# Patient Record
Sex: Female | Born: 1995 | Race: Black or African American | Hispanic: No | Marital: Single | Smoking: Former smoker
Health system: Southern US, Community
[De-identification: ages and names within clinical notes are randomized; demographics above are authoritative.]

## PROBLEM LIST (undated history)

## (undated) ENCOUNTER — Inpatient Hospital Stay (HOSPITAL_COMMUNITY): Payer: Self-pay

## (undated) DIAGNOSIS — F32A Depression, unspecified: Secondary | ICD-10-CM

## (undated) DIAGNOSIS — R87629 Unspecified abnormal cytological findings in specimens from vagina: Secondary | ICD-10-CM

## (undated) DIAGNOSIS — R0600 Dyspnea, unspecified: Secondary | ICD-10-CM

## (undated) DIAGNOSIS — A5901 Trichomonal vulvovaginitis: Secondary | ICD-10-CM

## (undated) DIAGNOSIS — G709 Myoneural disorder, unspecified: Secondary | ICD-10-CM

## (undated) DIAGNOSIS — D649 Anemia, unspecified: Secondary | ICD-10-CM

## (undated) DIAGNOSIS — A549 Gonococcal infection, unspecified: Secondary | ICD-10-CM

## (undated) DIAGNOSIS — F319 Bipolar disorder, unspecified: Secondary | ICD-10-CM

## (undated) HISTORY — PX: DILATION AND EVACUATION: SHX1459

## (undated) HISTORY — PX: DILATION AND CURETTAGE OF UTERUS: SHX78

## (undated) HISTORY — PX: HERNIA REPAIR: SHX51

---
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
## 2015-01-05 ENCOUNTER — Emergency Department (HOSPITAL_COMMUNITY)
Admission: EM | Admit: 2015-01-05 | Discharge: 2015-01-05 | Disposition: A | Discharge status: Home / Self Care | Payer: Self-pay | Attending: Emergency Medicine | Admitting: Emergency Medicine

## 2015-01-05 ENCOUNTER — Encounter (HOSPITAL_COMMUNITY): Payer: Self-pay | Admitting: Emergency Medicine

## 2015-01-05 DIAGNOSIS — Z792 Long term (current) use of antibiotics: Secondary | ICD-10-CM | POA: Insufficient documentation

## 2015-01-05 DIAGNOSIS — R51 Headache: Secondary | ICD-10-CM | POA: Insufficient documentation

## 2015-01-05 DIAGNOSIS — R519 Headache, unspecified: Secondary | ICD-10-CM

## 2015-01-05 DIAGNOSIS — Z79899 Other long term (current) drug therapy: Secondary | ICD-10-CM | POA: Insufficient documentation

## 2015-01-05 DIAGNOSIS — Z720 Tobacco use: Secondary | ICD-10-CM | POA: Insufficient documentation

## 2015-01-05 DIAGNOSIS — R110 Nausea: Secondary | ICD-10-CM | POA: Insufficient documentation

## 2015-01-05 MED ORDER — METOCLOPRAMIDE HCL 5 MG/ML IJ SOLN
10.0000 mg | Freq: Once | INTRAMUSCULAR | Status: AC
  Administered 2015-01-05: 10 mg via INTRAVENOUS
  Filled 2015-01-05: qty 2

## 2015-01-05 MED ORDER — KETOROLAC TROMETHAMINE 30 MG/ML IJ SOLN
30.0000 mg | Freq: Once | INTRAMUSCULAR | Status: AC
  Administered 2015-01-05: 30 mg via INTRAVENOUS
  Filled 2015-01-05: qty 1

## 2015-01-05 MED ORDER — DIPHENHYDRAMINE HCL 50 MG/ML IJ SOLN
12.5000 mg | Freq: Once | INTRAMUSCULAR | Status: AC
  Administered 2015-01-05: 12.5 mg via INTRAVENOUS
  Filled 2015-01-05: qty 1

## 2015-01-05 NOTE — ED Notes (Signed)
Patient has a headache since 01/02/2014 @ 1900. Patient took 8 Coricidin on saturday. Patient says that she smoked some weed and cigerrettes

## 2015-01-05 NOTE — ED Notes (Signed)
Patient is alert and oriented x3.  She was given DC instructions and follow up visit instructions.  Patient gave verbal understanding. She was DC ambulatory under her own power to home.  V/S stable.  He was not showing any signs of distress on DC 

## 2015-01-05 NOTE — ED Provider Notes (Signed)
CSN: [id number]     Arrival date & time 01/05/15  0350 History   First MD Initiated Contact with Patient 01/05/15 0359     Chief Complaint  Patient presents with  . Migraine     (Consider location/radiation/quality/duration/timing/severity/associated sxs/prior Treatment) Patient is a 19 y.o. female presenting with migraines. The history is provided by the patient. No language interpreter was used.  Migraine This is a new problem. The current episode started yesterday. The problem occurs constantly. The problem has been unchanged. Associated symptoms include headaches and nausea. Pertinent negatives include no chest pain, chills, congestion, coughing, fever, neck pain or weakness. Associated symptoms comments: Generalized headache that started yesterday, is described as throbbing, causing nausea without vomiting. No fever, sinus congestion, sore throat or cough. Nothing makes it better and standing or sitting up makes it worse. She denies injury.Marland Kitchen.    History reviewed. No pertinent past medical history. History reviewed. No pertinent past surgical history. History reviewed. No pertinent family history. History  Substance Use Topics  . Smoking status: Current Every Day Smoker  . Smokeless tobacco: Never Used  . Alcohol Use: No   OB History    No data available     Review of Systems  Constitutional: Negative for fever and chills.  HENT: Negative for congestion and sinus pressure.   Respiratory: Negative.  Negative for cough.   Cardiovascular: Negative.  Negative for chest pain.  Gastrointestinal: Positive for nausea.  Musculoskeletal: Negative.  Negative for neck pain and neck stiffness.  Skin: Negative.   Neurological: Positive for headaches. Negative for syncope and weakness.      Allergies  Review of patient's allergies indicates no known allergies.  Home Medications   Prior to Admission medications   Medication Sig Start Date End Date Taking? Authorizing Provider   Chlorpheniramine-DM 4-30 MG TABS Take 2 tablets by mouth every 6 (six) hours as needed (for pain).   Yes Historical Provider, MD  cephALEXin (KEFLEX) 500 MG capsule Take 1 capsule (500 mg total) by mouth 2 (two) times daily. Patient not taking: Reported on 01/05/2015 11/17/14   Junius FinnerErin O'Malley, PA-C  ibuprofen (ADVIL,MOTRIN) 600 MG tablet Take 1 tablet (600 mg total) by mouth every 6 (six) hours as needed. Patient not taking: Reported on 01/05/2015 11/17/14   Junius FinnerErin O'Malley, PA-C  potassium chloride (K-DUR) 10 MEQ tablet Take 2 tablets (20 mEq total) by mouth daily. Patient not taking: Reported on 01/05/2015 11/19/14   Antony MaduraKelly Humes, PA-C  traMADol (ULTRAM) 50 MG tablet Take 1 tablet (50 mg total) by mouth every 6 (six) hours as needed. Patient not taking: Reported on 01/05/2015 11/17/14   Junius FinnerErin O'Malley, PA-C   BP 109/47 mmHg  Pulse 58  Temp(Src) 98.4 F (36.9 C) (Oral)  Resp 18  SpO2 99% Physical Exam  Constitutional: She is oriented to person, place, and time. She appears well-developed and well-nourished.  HENT:  Head: Normocephalic.  Nose: No mucosal edema. Right sinus exhibits no frontal sinus tenderness. Left sinus exhibits no frontal sinus tenderness.  Neck: Normal range of motion. Neck supple.  Cardiovascular: Normal rate and regular rhythm.   Pulmonary/Chest: Effort normal and breath sounds normal.  Abdominal: Soft. Bowel sounds are normal. There is no tenderness. There is no rebound and no guarding.  Musculoskeletal: Normal range of motion.  Neurological: She is alert and oriented to person, place, and time. Coordination normal.  CN's 3-12 grossly intact. Speech clear and focused. No coordination deficits.   Skin: Skin is warm and dry. No rash  noted.  Psychiatric: She has a normal mood and affect.    ED Course  Procedures (including critical care time) Labs Review Labs Reviewed - No data to display  Imaging Review No results found.   EKG Interpretation None      MDM    Final diagnoses:  None    1. Nonspecific headache  No neurologic deficits on exam. Headache better with medications in ED. VSS. She is felt appropriate for discharge home without concern for acute intracranial process.    Elpidio AnisShari Gilles Trimpe, PA-C 01/05/15 [id number]  Dione Boozeavid Glick, MD 01/05/15 [phone]

## 2015-01-05 NOTE — ED Notes (Signed)
Bed: [id number] Expected date: 01/05/15 Expected time: 3:28 AM Means of arrival: Ambulance Comments: headache

## 2015-01-05 NOTE — Discharge Instructions (Signed)
°Emergency Department Resource Guide °1) Find a Doctor and Pay Out of Pocket °Although you won't have to find out who is covered by your insurance plan, it is a good idea to ask around and get recommendations. You will then need to call the office and see if the doctor you have chosen will accept you as a new patient and what types of options they offer for patients who are self-pay. Some doctors offer discounts or will set up payment plans for their patients who do not have insurance, but you will need to ask so you aren't surprised when you get to your appointment. ° °2) Contact Your Local Health Department °Not all health departments have doctors that can see patients for sick visits, but many do, so it is worth a call to see if yours does. If you don't know where your local health department is, you can check in your phone book. The CDC also has a tool to help you locate your state's health department, and many state websites also have listings of all of their local health departments. ° °3) Find a Walk-in Clinic °If your illness is not likely to be very severe or complicated, you may want to try a walk in clinic. These are popping up all over the country in pharmacies, drugstores, and shopping centers. They're usually staffed by nurse practitioners or physician assistants that have been trained to treat common illnesses and complaints. They're usually fairly quick and inexpensive. However, if you have serious medical issues or chronic medical problems, these are probably not your best option. ° °No Primary Care Doctor: °- Call [facility] at  832-8000 - they can help you locate a primary care doctor that  accepts your insurance, provides certain services, etc. °- Physician Referral Service- 1-800-533-3463 ° °Chronic Pain Problems: °Organization         Address  Phone   Notes  °[facility]  [phone] Patients need to be referred by their primary care doctor.  ° °Medication  Assistance: °Organization         Address  Phone   Notes  °[place] [facility] [address] °[place], [place] [address] [phone] --Must be a resident of [place] °-- Must have NO insurance coverage whatsoever (no Medicaid/ Medicare, etc.) °-- The pt. MUST have a primary care doctor that directs their care regularly and follows them in the community °  °[facility]  (866) 331-1348   °[employer]  (888) 892-1162   ° °Agencies that provide inexpensive medical care: °Organization         Address  Phone   Notes  °[facility]  [phone]   °[facility]    [phone]   °[facility] [address] °[place], [place] [address] [phone]   °[facility] [address], °[place] [phone]   °[facility]    ([phone]) [phone]   °[facility]    ([phone]) [phone]   °[facility] ° [address], [place] Phone:  ([phone]) [phone], Fax:  [phone] Hours of Operation:  9 am - 6 pm, M-F.  Also accepts Medicaid/Medicare and self-pay.  °[facility] ° [address], [place] Phone: [phone], Fax: [phone]. Hours of Operation:  8:30 am - 5:30 pm, M-F.  Also accepts Medicaid and self-pay.  °[facility] [place] [address], [place] Phone: [phone](336) [phone]   [facility] [address], [place], [place] [phone], Ext. [phone] Mondays & Thursdays: 7-9 AM.  First 15 patients are seen on a first come, first serve basis.    Medicaid-accepting [facility] Providers:  Organization         Address  Phone   Notes  [facility] [address], [place] [phone] Also accepts self-pay patients.  [facility] [address], [place]  [phone]   [facility] [address], [place]  [phone]   [facility] [address] [place] [address], [place] [phone]   Renaye RakersVeita Bland [address], [place]   [phone] Only accepts [place] patients after they have their name applied to their card.   Self-Pay (no insurance) in [facility] [facility]:  Organization         Address  Phone   Notes  Sickle Cell Patients, [facility] [address], [place] [phone]   [facility] [address], [place] [phone]   [facility] [place]  [address], [place] [phone]   [facility]/Dr. Osei-Bonsu  [address] [place] [address], [place] or [phone], [place] [phone] Phone number for both [place] and [place] locations is the same.  [facility] [address], [place] [phone]   [facility] [place] [facility] [address] [place] [address], [place] or [address] [phone]   [facility] [address], [place] [phone], phone; [phone], fax Sees patients 1st and 3rd Saturday of every month.  Must not qualify for public or private insurance (i.e. Medicaid, Medicare, [place] [employer] Choice, Veterans' Benefits)  Household income should be no more than 200% of the poverty level The clinic cannot treat you if you are pregnant or think you are pregnant  Sexually transmitted diseases are not treated at the clinic.       General Headache Without Cause A headache is pain or discomfort felt around the head or neck area. The specific cause of a headache may not be found. There are many causes and types of headaches. A few common ones are:  Tension headaches.  Migraine headaches.  Cluster headaches.  Chronic daily headaches. HOME CARE INSTRUCTIONS   Keep all follow-up appointments with your caregiver or any specialist referral.  Only take  over-the-counter or prescription medicines for pain or discomfort as directed by your caregiver.  Lie down in a dark, quiet room when you have a headache.  Keep a headache journal to find out what may trigger your migraine headaches. For example, write down:  What you eat and drink.  How much sleep you get.  Any change to your diet or medicines.  Try massage or other relaxation techniques.  Put ice packs or heat on the head and neck. Use these 3 to 4 times per day for 15 to 20 minutes each time, or as needed.  Limit stress.  Sit up straight, and do not tense your muscles.  Quit smoking if you smoke.  Limit alcohol use.  Decrease the amount of caffeine you drink, or stop drinking caffeine.  Eat and sleep on a regular schedule.  Get 7 to 9 hours of sleep, or  as recommended by your caregiver.  Keep lights dim if bright lights bother you and make your headaches worse. SEEK MEDICAL CARE IF:   You have problems with the medicines you were prescribed.  Your medicines are not working.  You have a change from the usual headache.  You have nausea or vomiting. SEEK IMMEDIATE MEDICAL CARE IF:   Your headache becomes severe.  You have a fever.  You have a stiff neck.  You have loss of vision.  You have muscular weakness or loss of muscle control.  You start losing your balance or have trouble walking.  You feel faint or pass out.  You have severe symptoms that are different from your first symptoms. MAKE SURE YOU:   Understand these instructions.  Will watch your condition.  Will get help right away if you are not doing well or get worse. Document Released: 08/08/2005 Document Revised: 10/31/2011 Document Reviewed: 08/24/2011 [facility] Patient Information 2015 [place] [place]. This information is not intended to replace advice given to you by your health care provider. Make sure you discuss any questions you have with your health care provider.

## 2015-06-08 ENCOUNTER — Encounter (HOSPITAL_COMMUNITY): Payer: Self-pay | Admitting: *Deleted

## 2015-06-08 ENCOUNTER — Inpatient Hospital Stay (HOSPITAL_COMMUNITY)
Admission: AD | Admit: 2015-06-08 | Discharge: 2015-06-08 | Disposition: A | Discharge status: Home / Self Care | Payer: Medicaid Other | Source: Ambulatory Visit | Attending: Family Medicine | Admitting: Family Medicine

## 2015-06-08 DIAGNOSIS — N926 Irregular menstruation, unspecified: Secondary | ICD-10-CM

## 2015-06-08 DIAGNOSIS — Z3201 Encounter for pregnancy test, result positive: Secondary | ICD-10-CM | POA: Diagnosis not present

## 2015-06-08 DIAGNOSIS — Z3200 Encounter for pregnancy test, result unknown: Secondary | ICD-10-CM | POA: Diagnosis present

## 2015-06-08 LAB — POCT PREGNANCY, URINE: Preg Test, Ur: POSITIVE — AB

## 2015-06-08 NOTE — Discharge Instructions (Signed)
First Trimester of Pregnancy The first trimester of pregnancy is from week 1 until the end of week 12 (months 1 through 3). A week after a sperm fertilizes an egg, the egg will implant on the wall of the uterus. This embryo will begin to develop into a baby. Genes from you and your partner are forming the baby. The female genes determine whether the baby is a boy or a girl. At 6-8 weeks, the eyes and face are formed, and the heartbeat can be seen on ultrasound. At the end of 12 weeks, all the baby's organs are formed.  Now that you are pregnant, you will want to do everything you can to have a healthy baby. Two of the most important things are to get good prenatal care and to follow your health care provider's instructions. Prenatal care is all the medical care you receive before the baby's birth. This care will help prevent, find, and treat any problems during the pregnancy and childbirth. BODY CHANGES Your body goes through many changes during pregnancy. The changes vary from woman to woman.   You may gain or lose a couple of pounds at first.  You may feel sick to your stomach (nauseous) and throw up (vomit). If the vomiting is uncontrollable, call your health care provider.  You may tire easily.  You may develop headaches that can be relieved by medicines approved by your health care provider.  You may urinate more often. Painful urination may mean you have a bladder infection.  You may develop heartburn as a result of your pregnancy.  You may develop constipation because certain hormones are causing the muscles that push waste through your intestines to slow down.  You may develop hemorrhoids or swollen, bulging veins (varicose veins).  Your breasts may begin to grow larger and become tender. Your nipples may stick out more, and the tissue that surrounds them (areola) may become darker.  Your gums may bleed and may be sensitive to brushing and flossing.  Dark spots or blotches  (chloasma, mask of pregnancy) may develop on your face. This will likely fade after the baby is born.  Your menstrual periods will stop.  You may have a loss of appetite.  You may develop cravings for certain kinds of food.  You may have changes in your emotions from day to day, such as being excited to be pregnant or being concerned that something may go wrong with the pregnancy and baby.  You may have more vivid and strange dreams.  You may have changes in your hair. These can include thickening of your hair, rapid growth, and changes in texture. Some women also have hair loss during or after pregnancy, or hair that feels dry or thin. Your hair will most likely return to normal after your baby is born. WHAT TO EXPECT AT YOUR PRENATAL VISITS During a routine prenatal visit:  You will be weighed to make sure you and the baby are growing normally.  Your blood pressure will be taken.  Your abdomen will be measured to track your baby's growth.  The fetal heartbeat will be listened to starting around week 10 or 12 of your pregnancy.  Test results from any previous visits will be discussed. Your health care provider may ask you:  How you are feeling.  If you are feeling the baby move.  If you have had any abnormal symptoms, such as leaking fluid, bleeding, severe headaches, or abdominal cramping.  If you are using any tobacco products,   including cigarettes, chewing tobacco, and electronic cigarettes.  If you have any questions. Other tests that may be performed during your first trimester include:  Blood tests to find your blood type and to check for the presence of any previous infections. They will also be used to check for low iron levels (anemia) and Rh antibodies. Later in the pregnancy, blood tests for diabetes will be done along with other tests if problems develop.  Urine tests to check for infections, diabetes, or protein in the urine.  An ultrasound to confirm the  proper growth and development of the baby.  An amniocentesis to check for possible genetic problems.  Fetal screens for spina bifida and Down syndrome.  You may need other tests to make sure you and the baby are doing well.  HIV (human immunodeficiency virus) testing. Routine prenatal testing includes screening for HIV, unless you choose not to have this test. HOME CARE INSTRUCTIONS  Medicines  Follow your health care provider's instructions regarding medicine use. Specific medicines may be either safe or unsafe to take during pregnancy.  Take your prenatal vitamins as directed.  If you develop constipation, try taking a stool softener if your health care provider approves. Diet  Eat regular, well-balanced meals. Choose a variety of foods, such as meat or vegetable-based protein, fish, milk and low-fat dairy products, vegetables, fruits, and whole grain breads and cereals. Your health care provider will help you determine the amount of weight gain that is right for you.  Avoid raw meat and uncooked cheese. These carry germs that can cause birth defects in the baby.  Eating four or five small meals rather than three large meals a day may help relieve nausea and vomiting. If you start to feel nauseous, eating a few soda crackers can be helpful. Drinking liquids between meals instead of during meals also seems to help nausea and vomiting.  If you develop constipation, eat more high-fiber foods, such as fresh vegetables or fruit and whole grains. Drink enough fluids to keep your urine clear or pale yellow. Activity and Exercise  Exercise only as directed by your health care provider. Exercising will help you:  Control your weight.  Stay in shape.  Be prepared for labor and delivery.  Experiencing pain or cramping in the lower abdomen or low back is a good sign that you should stop exercising. Check with your health care provider before continuing normal exercises.  Try to avoid  standing for long periods of time. Move your legs often if you must stand in one place for a long time.  Avoid heavy lifting.  Wear low-heeled shoes, and practice good posture.  You may continue to have sex unless your health care provider directs you otherwise. Relief of Pain or Discomfort  Wear a good support bra for breast tenderness.   Take warm sitz baths to soothe any pain or discomfort caused by hemorrhoids. Use hemorrhoid cream if your health care provider approves.   Rest with your legs elevated if you have leg cramps or low back pain.  If you develop varicose veins in your legs, wear support hose. Elevate your feet for 15 minutes, 3-4 times a day. Limit salt in your diet. Prenatal Care  Schedule your prenatal visits by the twelfth week of pregnancy. They are usually scheduled monthly at first, then more often in the last 2 months before delivery.  Write down your questions. Take them to your prenatal visits.  Keep all your prenatal visits as directed by your   health care provider. Safety  Wear your seat belt at all times when driving.  Make a list of emergency phone numbers, including numbers for family, friends, the hospital, and police and fire departments. General Tips  Ask your health care provider for a referral to a local prenatal education class. Begin classes no later than at the beginning of month 6 of your pregnancy.  Ask for help if you have counseling or nutritional needs during pregnancy. Your health care provider can offer advice or refer you to specialists for help with various needs.  Do not use hot tubs, steam rooms, or saunas.  Do not douche or use tampons or scented sanitary pads.  Do not cross your legs for long periods of time.  Avoid cat litter boxes and soil used by cats. These carry germs that can cause birth defects in the baby and possibly loss of the fetus by miscarriage or stillbirth.  Avoid all smoking, herbs, alcohol, and medicines  not prescribed by your health care provider. Chemicals in these affect the formation and growth of the baby.  Do not use any tobacco products, including cigarettes, chewing tobacco, and electronic cigarettes. If you need help quitting, ask your health care provider. You may receive counseling support and other resources to help you quit.  Schedule a dentist appointment. At home, brush your teeth with a soft toothbrush and be gentle when you floss. SEEK MEDICAL CARE IF:   You have dizziness.  You have mild pelvic cramps, pelvic pressure, or nagging pain in the abdominal area.  You have persistent nausea, vomiting, or diarrhea.  You have a bad smelling vaginal discharge.  You have pain with urination.  You notice increased swelling in your face, hands, legs, or ankles. SEEK IMMEDIATE MEDICAL CARE IF:   You have a fever.  You are leaking fluid from your vagina.  You have spotting or bleeding from your vagina.  You have severe abdominal cramping or pain.  You have rapid weight gain or loss.  You vomit blood or material that looks like coffee grounds.  You are exposed to German measles and have never had them.  You are exposed to fifth disease or chickenpox.  You develop a severe headache.  You have shortness of breath.  You have any kind of trauma, such as from a fall or a car accident.   This information is not intended to replace advice given to you by your health care provider. Make sure you discuss any questions you have with your health care provider.   Document Released: 08/02/2001 Document Revised: 08/29/2014 Document Reviewed: 06/18/2013 Elsevier Interactive Patient Education 2016 [employer]  Safe Medications in Pregnancy   Acne: Benzoyl Peroxide Salicylic Acid  Backache/Headache: Tylenol: 2 regular strength every 4 hours OR              2 Extra strength every 6 hours  Colds/Coughs/Allergies: Benadryl (alcohol free) 25 mg every 6 hours as  needed Breath right strips Claritin Cepacol throat lozenges Chloraseptic throat spray Cold-Eeze- up to three times per day Cough drops, alcohol free Flonase (by prescription only) Guaifenesin Mucinex Robitussin DM (plain only, alcohol free) Saline nasal spray/drops Sudafed (pseudoephedrine) & Actifed ** use only after [redacted] weeks gestation and if you do not have high blood pressure Tylenol Vicks Vaporub Zinc lozenges Zyrtec   Constipation: Colace Ducolax suppositories Fleet enema Glycerin suppositories Metamucil Milk of magnesia Miralax Senokot Smooth move tea  Diarrhea: Kaopectate Imodium A-D  *NO pepto Bismol  Hemorrhoids: Anusol Anusol   HC Preparation H Tucks  Indigestion: Tums Maalox Mylanta Zantac  Pepcid  Insomnia: Benadryl (alcohol free) 25mg every 6 hours as needed Tylenol PM Unisom, no Gelcaps  Leg Cramps: Tums MagGel  Nausea/Vomiting:  Bonine Dramamine Emetrol Ginger extract Sea bands Meclizine  Nausea medication to take during pregnancy:  Unisom (doxylamine succinate 25 mg tablets) Take one tablet daily at bedtime. If symptoms are not adequately controlled, the dose can be increased to a maximum recommended dose of two tablets daily (1/2 tablet in the morning, 1/2 tablet mid-afternoon and one at bedtime). Vitamin B6 100mg tablets. Take one tablet twice a day (up to 200 mg per day).  Skin Rashes: Aveeno products Benadryl cream or 25mg every 6 hours as needed Calamine Lotion 1% cortisone cream  Yeast infection: Gyne-lotrimin 7 Monistat 7   **If taking multiple medications, please check labels to avoid duplicating the same active ingredients **take medication as directed on the label ** Do not exceed 4000 mg of tylenol in 24 hours **Do not take medications that contain aspirin or ibuprofen    

## 2015-06-08 NOTE — MAU Provider Note (Signed)
S:  Karen Frederick is a [age] y.o. G1P0 at 2915w0d wks here for confirmation of pregnancy.  Patient's Patient's last menstrual period was 05/11/2015.Marland Kitchen.  Denies any vaginal bleeding or abdominal pain.  Plans to get prenatal care at undecided.  O:  Past Medical History  Diagnosis Date  . Medical history non-contributory     Family History  Problem Relation Age of Onset  . Alcohol abuse Neg Hx   . Arthritis Neg Hx   . Asthma Neg Hx   . Birth defects Neg Hx   . Cancer Neg Hx   . COPD Neg Hx   . Depression Neg Hx   . Diabetes Neg Hx   . Drug abuse Neg Hx   . Early death Neg Hx   . Hearing loss Neg Hx   . Heart disease Neg Hx   . Hyperlipidemia Neg Hx   . Hypertension Neg Hx   . Kidney disease Neg Hx   . Learning disabilities Neg Hx   . Mental illness Neg Hx   . Mental retardation Neg Hx   . Miscarriages / Stillbirths Neg Hx   . Stroke Neg Hx   . Vision loss Neg Hx   . Varicose Veins Neg Hx      Filed Vitals:   06/08/15 1433  BP: 157/80  Pulse: 119  Temp: 98.6 F (37 C)  Resp: 18   General:  A&OX3 with no signs of acute distress. She appears well-developed and well-nourished. No distress.  Neck: Normal range of motion.  Pulmonary/Chest: Effort normal. No respiratory distress.  Musculoskeletal: Normal range of motion.  Neurological: She is alert and oriented to person, place, and time.  Skin: Skin is warm and dry.   A: Positive Pregnancy Test  P: Explained benefits of taking prenatal vitamins w/folic acid early in pregnancy. Begin prenatal care. Reviewed warning signs of pregnancy. Pregnancy verification letter & ob/gyn list given to patient Discussed purpose of MAU & when to return  Judeth HornErin Dawanna Grauberger, NP

## 2015-06-08 NOTE — MAU Note (Signed)
Denies any problems; had 2 positive pregnancy test at home; desires to get a verification letter;

## 2015-06-11 ENCOUNTER — Inpatient Hospital Stay (HOSPITAL_COMMUNITY)
Admission: AD | Admit: 2015-06-11 | Discharge: 2015-06-11 | Disposition: A | Discharge status: Home / Self Care | Payer: Medicaid Other | Source: Ambulatory Visit | Attending: Obstetrics & Gynecology | Admitting: Obstetrics & Gynecology

## 2015-06-11 ENCOUNTER — Encounter (HOSPITAL_COMMUNITY): Payer: Self-pay | Admitting: *Deleted

## 2015-06-11 ENCOUNTER — Inpatient Hospital Stay (HOSPITAL_COMMUNITY): Payer: Medicaid Other

## 2015-06-11 DIAGNOSIS — O3680X0 Pregnancy with inconclusive fetal viability, not applicable or unspecified: Secondary | ICD-10-CM

## 2015-06-11 DIAGNOSIS — O209 Hemorrhage in early pregnancy, unspecified: Secondary | ICD-10-CM

## 2015-06-11 DIAGNOSIS — Z3A01 Less than 8 weeks gestation of pregnancy: Secondary | ICD-10-CM

## 2015-06-11 DIAGNOSIS — R58 Hemorrhage, not elsewhere classified: Secondary | ICD-10-CM

## 2015-06-11 DIAGNOSIS — O26851 Spotting complicating pregnancy, first trimester: Secondary | ICD-10-CM | POA: Insufficient documentation

## 2015-06-11 DIAGNOSIS — F17210 Nicotine dependence, cigarettes, uncomplicated: Secondary | ICD-10-CM | POA: Diagnosis not present

## 2015-06-11 DIAGNOSIS — O99331 Smoking (tobacco) complicating pregnancy, first trimester: Secondary | ICD-10-CM | POA: Insufficient documentation

## 2015-06-11 HISTORY — DX: Trichomonal vulvovaginitis: A59.01

## 2015-06-11 LAB — CBC
HCT: 35.9 % — ABNORMAL LOW (ref 36.0–46.0)
Hemoglobin: 11.5 g/dL — ABNORMAL LOW (ref 12.0–15.0)
MCH: 27.3 pg (ref 26.0–34.0)
MCHC: 32 g/dL (ref 30.0–36.0)
MCV: 85.3 fL (ref 78.0–100.0)
Platelets: 225 10*3/uL (ref 150–400)
RBC: 4.21 MIL/uL (ref 3.87–5.11)
RDW: 14.6 % (ref 11.5–15.5)
WBC: 7.3 10*3/uL (ref 4.0–10.5)

## 2015-06-11 LAB — URINALYSIS, ROUTINE W REFLEX MICROSCOPIC
Bilirubin Urine: NEGATIVE
Glucose, UA: NEGATIVE mg/dL
KETONES UR: NEGATIVE mg/dL
Leukocytes, UA: NEGATIVE
Nitrite: NEGATIVE
PROTEIN: NEGATIVE mg/dL
Specific Gravity, Urine: 1.01 (ref 1.005–1.030)
Urobilinogen, UA: 0.2 mg/dL (ref 0.0–1.0)
pH: 6 (ref 5.0–8.0)

## 2015-06-11 LAB — ABO/RH: ABO/RH(D): O POS

## 2015-06-11 LAB — URINE MICROSCOPIC-ADD ON

## 2015-06-11 LAB — HCG, QUANTITATIVE, PREGNANCY: hCG, Beta Chain, Quant, S: 277 m[IU]/mL — ABNORMAL HIGH (ref <5)

## 2015-06-11 NOTE — MAU Note (Signed)
Started spotting last wk on Friday, when expected period.  Having very light cramping.

## 2015-06-11 NOTE — MAU Note (Signed)
Pt c/o spotting on and off with mild cramping. Pt wants to make sure everything is OK

## 2015-06-11 NOTE — Discharge Instructions (Signed)
Ectopic Pregnancy °An ectopic pregnancy is when the fertilized egg attaches (implants) outside the uterus. Most ectopic pregnancies occur in the fallopian tube. Rarely do ectopic pregnancies occur on the ovary, intestine, pelvis, or cervix. In an ectopic pregnancy, the fertilized egg does not have the ability to develop into a normal, healthy baby.  °A ruptured ectopic pregnancy is one in which the fallopian tube gets torn or bursts and results in internal bleeding. Often there is intense abdominal pain, and sometimes, vaginal bleeding. Having an ectopic pregnancy can be life threatening. If left untreated, this dangerous condition can lead to a blood transfusion, abdominal surgery, or even death. °CAUSES  °Damage to the fallopian tubes is the suspected cause in most ectopic pregnancies.  °RISK FACTORS °Depending on your circumstances, the risk of having an ectopic pregnancy will vary. The level of risk can be divided into three categories. °High Risk °· You have gone through infertility treatment. °· You have had a previous ectopic pregnancy. °· You have had previous tubal surgery. °· You have had previous surgery to have the fallopian tubes tied (tubal ligation). °· You have tubal problems or diseases. °· You have been exposed to DES. DES is a medicine that was used until 1971 and had effects on babies whose mothers took the medicine. °· You become pregnant while using an intrauterine device (IUD) for birth control.  °Moderate Risk °· You have a history of infertility. °· You have a history of a sexually transmitted infection (STI). °· You have a history of pelvic inflammatory disease (PID). °· You have scarring from endometriosis. °· You have multiple sexual partners. °· You smoke.  °Low Risk °· You have had previous pelvic surgery. °· You use vaginal douching. °· You became sexually active before 18 years of age. °SIGNS AND SYMPTOMS  °An ectopic pregnancy should be suspected in anyone who has missed a period and  has abdominal pain or bleeding. °· You may experience normal pregnancy symptoms, such as: °¨ Nausea. °¨ Tiredness. °¨ Breast tenderness. °· Other symptoms may include: °¨ Pain with intercourse. °¨ Irregular vaginal bleeding or spotting. °¨ Cramping or pain on one side or in the lower abdomen. °¨ Fast heartbeat. °¨ Passing out while having a bowel movement. °· Symptoms of a ruptured ectopic pregnancy and internal bleeding may include: °¨ Sudden, severe pain in the abdomen and pelvis. °¨ Dizziness or fainting. °¨ Pain in the shoulder area. °DIAGNOSIS  °Tests that may be performed include: °· A pregnancy test. °· An ultrasound test. °· Testing the specific level of pregnancy hormone in the bloodstream. °· Taking a sample of uterus tissue (dilation and curettage, D&C). °· Surgery to perform a visual exam of the inside of the abdomen using a thin, lighted tube with a tiny camera on the end (laparoscope). °TREATMENT  °An injection of a medicine called methotrexate may be given. This medicine causes the pregnancy tissue to be absorbed. It is given if: °· The diagnosis is made early. °· The fallopian tube has not ruptured. °· You are considered to be a good candidate for the medicine. °Usually, pregnancy hormone blood levels are checked after methotrexate treatment. This is to be sure the medicine is effective. It may take 4-6 weeks for the pregnancy to be absorbed (though most pregnancies will be absorbed by 3 weeks). °Surgical treatment may be needed. A laparoscope may be used to remove the pregnancy tissue. If severe internal bleeding occurs, a cut (incision) may be made in the lower abdomen (laparotomy), and the ectopic   pregnancy is removed. This stops the bleeding. Part of the fallopian tube, or the whole tube, may be removed as well (salpingectomy). After surgery, pregnancy hormone tests may be done to be sure there is no pregnancy tissue left. You may receive a Rho (D) immune globulin shot if you are Rh negative and  the father is Rh positive, or if you do not know the Rh type of the father. This is to prevent problems with any future pregnancy. SEEK IMMEDIATE MEDICAL CARE IF:  You have any symptoms of an ectopic pregnancy. This is a medical emergency. MAKE SURE YOU:  Understand these instructions.  Will watch your condition.  Will get help right away if you are not doing well or get worse.   This information is not intended to replace advice given to you by your health care provider. Make sure you discuss any questions you have with your health care provider.   Document Released: 09/15/2004 Document Revised: 08/29/2014 Document Reviewed: 03/07/2013 Elsevier Interactive Patient Education 2016 [employer] Threatened Miscarriage A threatened miscarriage occurs when you have vaginal bleeding during your first 20 weeks of pregnancy but the pregnancy has not ended. If you have vaginal bleeding during this time, your health care provider will do tests to make sure you are still pregnant. If the tests show you are still pregnant and the developing baby (fetus) inside your womb (uterus) is still growing, your condition is considered a threatened miscarriage. A threatened miscarriage does not mean your pregnancy will end, but it does increase the risk of losing your pregnancy (complete miscarriage). CAUSES  The cause of a threatened miscarriage is usually not known. If you go on to have a complete miscarriage, the most common cause is an abnormal number of chromosomes in the developing baby. Chromosomes are the structures inside cells that hold all your genetic material. Some causes of vaginal bleeding that do not result in miscarriage include:  Having sex.  Having an infection.  Normal hormone changes of pregnancy.  Bleeding that occurs when an egg implants in your uterus. RISK FACTORS Risk factors for bleeding in early pregnancy include:  Obesity.  Smoking.  Drinking excessive amounts of alcohol  or caffeine.  Recreational drug use. SIGNS AND SYMPTOMS  Light vaginal bleeding.  Mild abdominal pain or cramps. DIAGNOSIS  If you have bleeding with or without abdominal pain before 20 weeks of pregnancy, your health care provider will do tests to check whether you are still pregnant. One important test involves using sound waves and a computer (ultrasound) to create images of the inside of your uterus. Other tests include an internal exam of your vagina and uterus (pelvic exam) and measurement of your baby's heart rate.  You may be diagnosed with a threatened miscarriage if:  Ultrasound testing shows you are still pregnant.  Your baby's heart rate is strong.  A pelvic exam shows that the opening between your uterus and your vagina (cervix) is closed.  Your heart rate and blood pressure are stable.  Blood tests confirm you are still pregnant. TREATMENT  No treatments have been shown to prevent a threatened miscarriage from going on to a complete miscarriage. However, the right home care is important.  HOME CARE INSTRUCTIONS   Make sure you keep all your appointments for prenatal care. This is very important.  Get plenty of rest.  Do not have sex or use tampons if you have vaginal bleeding.  Do not douche.  Do not smoke or use recreational drugs.  Do not drink alcohol.  Avoid caffeine. SEEK MEDICAL CARE IF:  You have light vaginal bleeding or spotting while pregnant.  You have abdominal pain or cramping.  You have a fever. SEEK IMMEDIATE MEDICAL CARE IF:  You have heavy vaginal bleeding.  You have blood clots coming from your vagina.  You have severe low back pain or abdominal cramps.  You have fever, chills, and severe abdominal pain. MAKE SURE YOU:  Understand these instructions.  Will watch your condition.  Will get help right away if you are not doing well or get worse.   This information is not intended to replace advice given to you by your health  care provider. Make sure you discuss any questions you have with your health care provider.   Document Released: 08/08/2005 Document Revised: 08/13/2013 Document Reviewed: 06/04/2013 Elsevier Interactive Patient Education [employer]

## 2015-06-11 NOTE — MAU Provider Note (Signed)
History     CSN: [id number]  Arrival date and time: 06/11/15 3474   First Provider Initiated Contact with Patient 06/11/15 1105      Chief Complaint  Patient presents with  . Vaginal Bleeding   HPI Kalimah M Westerhoff 19 y.o. G1P0 @ [redacted]w[redacted]d presents with vaginal spotting and abdominal pain.    Past Medical History  Diagnosis Date  . Medical history non-contributory   . Trichomonal vaginitis     Past Surgical History  Procedure Laterality Date  . Hernia repair      umbilical    Family History  Problem Relation Age of Onset  . Alcohol abuse Neg Hx   . Arthritis Neg Hx   . Asthma Neg Hx   . Birth defects Neg Hx   . COPD Neg Hx   . Depression Neg Hx   . Drug abuse Neg Hx   . Early death Neg Hx   . Hearing loss Neg Hx   . Heart disease Neg Hx   . Hyperlipidemia Neg Hx   . Hypertension Neg Hx   . Kidney disease Neg Hx   . Learning disabilities Neg Hx   . Mental illness Neg Hx   . Mental retardation Neg Hx   . Miscarriages / Stillbirths Neg Hx   . Stroke Neg Hx   . Vision loss Neg Hx   . Varicose Veins Neg Hx   . Diabetes Paternal Grandmother   . Cancer Paternal Grandmother     breast with mets    Social History  Substance Use Topics  . Smoking status: Current Every Day Smoker -- 0.25 packs/day    Types: Cigarettes  . Smokeless tobacco: Never Used     Comment: trying to quit  . Alcohol Use: No    Allergies: No Known Allergies  Prescriptions prior to admission  Medication Sig Dispense Refill Last Dose  . Prenatal Vit-Fe Fumarate-FA (PRENATAL MULTIVITAMIN) TABS tablet Take 1 tablet by mouth daily at 12 noon.   06/10/2015 at Unknown time    Review of Systems  Constitutional: Negative for fever.  Gastrointestinal: Positive for abdominal pain.  Genitourinary:       Vaginal spotting  All other systems reviewed and are negative.  Physical Exam   Blood pressure 144/75, pulse 82, temperature 98.8 F (37.1 C), temperature source Oral, resp. rate 18, height 5'  7" (1.702 m), weight 98.249 kg (216 lb 9.6 oz), last menstrual period 05/11/2015.  Physical Exam  Nursing note and vitals reviewed. Constitutional: She is oriented to person, place, and time. She appears well-developed and well-nourished.  HENT:  Head: Normocephalic and atraumatic.  Cardiovascular: Normal rate.   Respiratory: Effort normal. No respiratory distress.  GI: Soft. There is no tenderness.  Musculoskeletal: Normal range of motion.  Neurological: She is alert and oriented to person, place, and time.  Skin: Skin is warm and dry.  Psychiatric: She has a normal mood and affect. Her behavior is normal. Judgment and thought content normal.   Results for orders placed or performed during the hospital encounter of 06/11/15 (from the past 24 hour(s))  Urinalysis, Routine w reflex microscopic (not at [facility])     Status: Abnormal   Collection Time: 06/11/15 10:25 AM  Result Value Ref Range   Color, Urine YELLOW YELLOW   APPearance CLEAR CLEAR   Specific Gravity, Urine 1.010 1.005 - 1.030   pH 6.0 5.0 - 8.0   Glucose, UA NEGATIVE NEGATIVE mg/dL   Hgb urine dipstick TRACE (A) NEGATIVE  Bilirubin Urine NEGATIVE NEGATIVE   Ketones, ur NEGATIVE NEGATIVE mg/dL   Protein, ur NEGATIVE NEGATIVE mg/dL   Urobilinogen, UA 0.2 0.0 - 1.0 mg/dL   Nitrite NEGATIVE NEGATIVE   Leukocytes, UA NEGATIVE NEGATIVE  Urine microscopic-add on     Status: None   Collection Time: 06/11/15 10:25 AM  Result Value Ref Range   Squamous Epithelial / LPF RARE RARE   RBC / HPF 0-2 <3 RBC/hpf   Bacteria, UA RARE RARE  hCG, quantitative, pregnancy     Status: Abnormal   Collection Time: 06/11/15 11:10 AM  Result Value Ref Range   hCG, Beta Chain, Quant, S 277 (H) <5 mIU/mL  CBC     Status: Abnormal   Collection Time: 06/11/15 11:10 AM  Result Value Ref Range   WBC 7.3 4.0 - 10.5 K/uL   RBC 4.21 3.87 - 5.11 MIL/uL   Hemoglobin 11.5 (L) 12.0 - 15.0 g/dL   HCT 16.1 (L) 09.6 - 04.5 %   MCV 85.3 78.0 - 100.0 fL    MCH 27.3 26.0 - 34.0 pg   MCHC 32.0 30.0 - 36.0 g/dL   RDW 40.9 81.1 - 91.4 %   Platelets 225 150 - 400 K/uL  ABO/Rh     Status: None (Preliminary result)   Collection Time: 06/11/15 11:10 AM  Result Value Ref Range   ABO/RH(D) O POS   US Ob Comp Less 14 Wks  06/11/2015  CLINICAL DATA:  Bleeding and spotting intermittently, cramping. Symptoms since Friday 06/05/2015. Gravida 1 para 0. LMP 05/11/2015. Gestational age by LMP is 4 weeks 3 days. EDC by LMP is 02/15/2016. EXAM: OBSTETRIC <14 WK [place] AND TRANSVAGINAL OB US TECHNIQUE: Both transabdominal and transvaginal ultrasound examinations were performed for complete evaluation of the gestation as well as the maternal uterus, adnexal regions, and pelvic cul-de-sac. Transvaginal technique was performed to assess early pregnancy. COMPARISON:  None. FINDINGS: Intrauterine gestational sac: Present, very small Yolk sac:  No Embryo:  No Cardiac Activity: No MSD: 1.7 gestational age: Not applicable Maternal uterus/adnexae: No subchorionic hemorrhage. Right ovary has a normal appearance. Left corpus luteum cyst. Small amount of free pelvic fluid. IMPRESSION: 1. Probable early intrauterine gestational sac, but no yolk sac, fetal pole, or cardiac activity yet visualized. Recommend follow-up quantitative B-HCG levels and follow-up US in 14 days to confirm and assess viability. This recommendation follows SRU consensus guidelines: Diagnostic Criteria for Nonviable Pregnancy Early in the First Trimester. Malva Limes Med 2013; 782:9562-13. 2. No adnexal mass or evidence for ectopic pregnancy. 3. Serial quantitative beta HCG values and follow-up ultrasound are recommended as appropriate to document progression of and location of pregnancy. Ectopic pregnancy has not been excluded. Electronically Signed   By: Norva Pavlov M.D.   On: 06/11/2015 12:09   US Ob Transvaginal  06/11/2015  CLINICAL DATA:  Bleeding and spotting intermittently, cramping. Symptoms since Friday  06/05/2015. Gravida 1 para 0. LMP 05/11/2015. Gestational age by LMP is 4 weeks 3 days. EDC by LMP is 02/15/2016. EXAM: OBSTETRIC <14 WK [place] AND TRANSVAGINAL OB US TECHNIQUE: Both transabdominal and transvaginal ultrasound examinations were performed for complete evaluation of the gestation as well as the maternal uterus, adnexal regions, and pelvic cul-de-sac. Transvaginal technique was performed to assess early pregnancy. COMPARISON:  None. FINDINGS: Intrauterine gestational sac: Present, very small Yolk sac:  No Embryo:  No Cardiac Activity: No MSD: 1.7 gestational age: Not applicable Maternal uterus/adnexae: No subchorionic hemorrhage. Right ovary has a normal appearance. Left corpus luteum  cyst. Small amount of free pelvic fluid. IMPRESSION: 1. Probable early intrauterine gestational sac, but no yolk sac, fetal pole, or cardiac activity yet visualized. Recommend follow-up quantitative B-HCG levels and follow-up US in 14 days to confirm and assess viability. This recommendation follows SRU consensus guidelines: Diagnostic Criteria for Nonviable Pregnancy Early in the First Trimester. Malva Limes Engl J Med 2013; 284:1324-40; 369:1443-51. 2. No adnexal mass or evidence for ectopic pregnancy. 3. Serial quantitative beta HCG values and follow-up ultrasound are recommended as appropriate to document progression of and location of pregnancy. Ectopic pregnancy has not been excluded. Electronically Signed   By: Norva PavlovElizabeth  Brown M.D.   On: 06/11/2015 12:09   MAU Course  Procedures  MDM R/O ectopic- No evidence of ectopic pregnancy.   Assessment and Plan  Pregnancy of unknown location Repeat Beta HCG in 2 days Discharge to home- Ectopic /Bleeding Precautions  Afreen Siebels Grissett 06/11/2015, 12:36 PM

## 2015-06-13 ENCOUNTER — Encounter (HOSPITAL_COMMUNITY): Payer: Self-pay | Admitting: *Deleted

## 2015-06-13 ENCOUNTER — Inpatient Hospital Stay (HOSPITAL_COMMUNITY)
Admission: AD | Admit: 2015-06-13 | Discharge: 2015-06-13 | Disposition: A | Discharge status: Home / Self Care | Payer: Medicaid Other | Source: Ambulatory Visit | Attending: Family Medicine | Admitting: Family Medicine

## 2015-06-13 DIAGNOSIS — Z3A01 Less than 8 weeks gestation of pregnancy: Secondary | ICD-10-CM | POA: Insufficient documentation

## 2015-06-13 DIAGNOSIS — O26891 Other specified pregnancy related conditions, first trimester: Secondary | ICD-10-CM | POA: Insufficient documentation

## 2015-06-13 LAB — HCG, QUANTITATIVE, PREGNANCY: hCG, Beta Chain, Quant, S: 650 m[IU]/mL — ABNORMAL HIGH (ref <5)

## 2015-06-13 NOTE — MAU Provider Note (Signed)
Chief Complaint  Patient presents with  . Follow-up    Subjective:   Pt is a 19 y.o. G1P0 here for follow-up BHCG.  Upon review of the records patient was first seen on 10-20-16for vaginal bleeding (was not having abdominal pain).   BHCG on that day was 277.  Ultrasound showed IUGS small, no yolk sac, no embryo, nothing suspicious for ectopic pregnancy seen .  GC/CT and wet prep were not collected at first visit.   Pt discharged home 06-11-15.   Pt here today with no report of abdominal pain or vaginal bleeding.   All other systems negative.  Last seen in MAU on 06-11-15.      BHCG was 277.    Past Medical History  Diagnosis Date  . Medical history non-contributory   . Trichomonal vaginitis     OB History  Gravida Para Term Preterm AB SAB TAB Ectopic Multiple Living  1             # Outcome Date GA Lbr Len/2nd Weight Sex Delivery Anes PTL Lv  1 Current               Family History  Problem Relation Age of Onset  . Alcohol abuse Neg Hx   . Arthritis Neg Hx   . Asthma Neg Hx   . Birth defects Neg Hx   . COPD Neg Hx   . Depression Neg Hx   . Drug abuse Neg Hx   . Early death Neg Hx   . Hearing loss Neg Hx   . Heart disease Neg Hx   . Hyperlipidemia Neg Hx   . Hypertension Neg Hx   . Kidney disease Neg Hx   . Learning disabilities Neg Hx   . Mental illness Neg Hx   . Mental retardation Neg Hx   . Miscarriages / Stillbirths Neg Hx   . Stroke Neg Hx   . Vision loss Neg Hx   . Varicose Veins Neg Hx   . Diabetes Paternal Grandmother   . Cancer Paternal Grandmother     breast with mets    Objective: Physical Exam  Filed Vitals:   06/13/15 1136  BP: 105/65  Pulse: 79  Temp: 98.5 F (36.9 C)  Resp: 18   Constitutional: She is oriented to person, place, and time. She appears well-developed and well-nourished. No distress.  Pulmonary/Chest: Effort normal. No respiratory distress.  Musculoskeletal: Normal range of motion.  Neurological: She is alert and oriented to  person, place, and time.  Skin: Skin is warm and dry.  Results for orders placed or performed during the hospital encounter of 06/13/15 (from the past 24 hour(s))  hCG, quantitative, pregnancy     Status: Abnormal   Collection Time: 06/13/15 10:50 AM  Result Value Ref Range   hCG, Beta Chain, Quant, S 650 (H) <5 mIU/mL     Assessment: 19 y.o. G1P0 at 5536w5d wks Pregnancy in no distress and no vaginal bleeding.  Has never had lower abdominal pain in this pregnancy. Appropriately rising BHCG - doubled  Plan: Begin prenatal care as soon as possible. Pregnancy verification given. Advised to return with any further vaginal bleeding or if she is having lower abdominal pain.

## 2015-06-13 NOTE — MAU Note (Signed)
Feeling good.  Had light spotting yesterday morning, none since.  Denies any pain

## 2015-07-02 ENCOUNTER — Inpatient Hospital Stay (HOSPITAL_COMMUNITY): Payer: Medicaid Other

## 2015-07-02 ENCOUNTER — Inpatient Hospital Stay (HOSPITAL_COMMUNITY)
Admission: AD | Admit: 2015-07-02 | Discharge: 2015-07-02 | Disposition: A | Discharge status: Home / Self Care | Payer: Medicaid Other | Source: Ambulatory Visit | Attending: Family Medicine | Admitting: Family Medicine

## 2015-07-02 ENCOUNTER — Encounter (HOSPITAL_COMMUNITY): Payer: Self-pay

## 2015-07-02 DIAGNOSIS — B9689 Other specified bacterial agents as the cause of diseases classified elsewhere: Secondary | ICD-10-CM

## 2015-07-02 DIAGNOSIS — A499 Bacterial infection, unspecified: Secondary | ICD-10-CM | POA: Diagnosis not present

## 2015-07-02 DIAGNOSIS — N760 Acute vaginitis: Secondary | ICD-10-CM | POA: Insufficient documentation

## 2015-07-02 DIAGNOSIS — Z3A01 Less than 8 weeks gestation of pregnancy: Secondary | ICD-10-CM | POA: Diagnosis not present

## 2015-07-02 DIAGNOSIS — O209 Hemorrhage in early pregnancy, unspecified: Secondary | ICD-10-CM

## 2015-07-02 DIAGNOSIS — O23591 Infection of other part of genital tract in pregnancy, first trimester: Secondary | ICD-10-CM | POA: Diagnosis not present

## 2015-07-02 DIAGNOSIS — F17200 Nicotine dependence, unspecified, uncomplicated: Secondary | ICD-10-CM | POA: Diagnosis not present

## 2015-07-02 DIAGNOSIS — N898 Other specified noninflammatory disorders of vagina: Secondary | ICD-10-CM | POA: Diagnosis present

## 2015-07-02 LAB — WET PREP, GENITAL
Trich, Wet Prep: NONE SEEN
Yeast Wet Prep HPF POC: NONE SEEN

## 2015-07-02 MED ORDER — METRONIDAZOLE 500 MG PO TABS: 500.0000 mg | ORAL_TABLET | Freq: Two times a day (BID) | ORAL | Status: DC

## 2015-07-02 NOTE — MAU Provider Note (Signed)
History     CSN: [id number]  Arrival date and time: 07/02/15 0907   First Provider Initiated Contact with Patient 07/02/15 1009      Chief Complaint  Patient presents with  . Vaginal Discharge   HPI   Ms.Karen Frederick is a 19 y.o. female G1P0 presenting to MAU with  vaginal discharge that she first noticed last week. The discharge is white without an odor. The discharge is more than what she normally has.  She was seen in MAU a week or so ago with vaginal bleeding and had blood work and an US done. Her Quants doubled appropriately and she has not had a second US done to confirm IUP.  Today she denies pain or bleeding.   OB History    Gravida Para Term Preterm AB TAB SAB Ectopic Multiple Living   1               Past Medical History  Diagnosis Date  . Medical history non-contributory   . Trichomonal vaginitis     Past Surgical History  Procedure Laterality Date  . Hernia repair      umbilical    Family History  Problem Relation Age of Onset  . Alcohol abuse Neg Hx   . Arthritis Neg Hx   . Asthma Neg Hx   . Birth defects Neg Hx   . COPD Neg Hx   . Depression Neg Hx   . Drug abuse Neg Hx   . Early death Neg Hx   . Hearing loss Neg Hx   . Heart disease Neg Hx   . Hyperlipidemia Neg Hx   . Hypertension Neg Hx   . Kidney disease Neg Hx   . Learning disabilities Neg Hx   . Mental illness Neg Hx   . Mental retardation Neg Hx   . Miscarriages / Stillbirths Neg Hx   . Stroke Neg Hx   . Vision loss Neg Hx   . Varicose Veins Neg Hx   . Diabetes Paternal Grandmother   . Cancer Paternal Grandmother     breast with mets    Social History  Substance Use Topics  . Smoking status: Current Every Day Smoker -- 0.25 packs/day    Types: Cigarettes  . Smokeless tobacco: Never Used     Comment: trying to quit  . Alcohol Use: No    Allergies: No Known Allergies  Prescriptions prior to admission  Medication Sig Dispense Refill Last Dose  . Prenatal Vit-Fe  Fumarate-FA (PRENATAL MULTIVITAMIN) TABS tablet Take 1 tablet by mouth daily at 12 noon.   07/01/2015 at Unknown time   Results for orders placed or performed during the hospital encounter of 07/02/15 (from the past 24 hour(s))  Wet prep, genital     Status: Abnormal   Collection Time: 07/02/15  9:30 AM  Result Value Ref Range   Yeast Wet Prep HPF POC NONE SEEN NONE SEEN   Trich, Wet Prep NONE SEEN NONE SEEN   Clue Cells Wet Prep HPF POC FEW (A) NONE SEEN   WBC, Wet Prep HPF POC FEW (A) NONE SEEN   US Ob Transvaginal  07/02/2015  CLINICAL DATA:  Pregnant, vaginal bleeding EXAM: TRANSVAGINAL OB ULTRASOUND TECHNIQUE: Transvaginal ultrasound was performed for complete evaluation of the gestation as well as the maternal uterus, adnexal regions, and pelvic cul-de-sac. COMPARISON:  06/11/2015 FINDINGS: Intrauterine gestational sac: Visualized/normal in shape. Yolk sac:  Present Embryo:  Present Cardiac Activity: Present Heart Rate: 144 bpm CRL:  12.0  mm   7 w 3 d                  US EDC: 02/15/2016 Maternal uterus/adnexae: Small subchronic hemorrhage. Bilateral ovaries are within normal limits, noting a left corpus luteal cyst. Small volume pelvic ascites. IMPRESSION: Single live intrauterine gestation with estimated gestational age [redacted] weeks 3 days by crown-rump length. Electronically Signed   By: Charline BillsSriyesh  Krishnan M.D.   On: 07/02/2015 11:04    Review of Systems  Gastrointestinal: Negative for abdominal pain.  Genitourinary:       Denies bleeding    Physical Exam   Blood pressure 138/64, pulse 95, temperature 98.5 F (36.9 C), temperature source Oral, resp. rate 16, last menstrual period 05/11/2015.  Physical Exam  Constitutional: She is oriented to person, place, and time. She appears well-developed and well-nourished. No distress.  HENT:  Head: Normocephalic.  Eyes: Pupils are equal, round, and reactive to light.  Respiratory: Effort normal.  Genitourinary:  Wet prep and GC collected by  RN   Musculoskeletal: Normal range of motion.  Neurological: She is alert and oriented to person, place, and time.  Skin: Skin is warm. She is not diaphoretic.  Psychiatric: Her behavior is normal.    MAU Course  Procedures  None  MDM  Quant 10/20: 277 Quant 10/22: 652 US on 10/20: gestational sac, no Yolk sac US done today.   Assessment and Plan   A:  1. Bacterial vaginosis   2. Vaginal bleeding before [redacted] weeks gestation    P:  Discharge home in stable condition Start prenatal care Prenatal vitamins daily First trimester warning signs  Duane LopeJennifer I Talesha Ellithorpe, NP 07/02/2015 6:59 PM

## 2015-07-02 NOTE — Discharge Instructions (Signed)

## 2015-07-02 NOTE — MAU Note (Signed)
[facility], having slight discharge, wondering if she has a yeast infection.  Denies pain or bleeding.

## 2015-07-03 LAB — OB RESULTS CONSOLE GC/CHLAMYDIA
Chlamydia: NEGATIVE
Gonorrhea: NEGATIVE

## 2015-07-03 LAB — GC/CHLAMYDIA PROBE AMP (~~LOC~~) NOT AT ARMC
CHLAMYDIA, DNA PROBE: NEGATIVE
Neisseria Gonorrhea: NEGATIVE

## 2015-08-23 NOTE — L&D Delivery Note (Addendum)
Delivery Note At 3:19 PM a viable female Marlene Bast( [place])  was delivered via Vaginal, Spontaneous Delivery (Presentation: Left Occiput Anterior).  APGAR: 9, 9; weight  .   Placenta status: Intact, Spontaneous.  Cord: 3 vessels with the following complications: None.  Cord pH: NA  Anesthesia: Epidural  Episiotomy: None Lacerations: 1st degree Suture Repair: 3.0 vicryl Est. Blood Loss (mL): 300 pt with uterine atony.. 1000 mcg of cytotec placed per rectum   Mom to postpartum.  Baby to Couplet care / Skin to Skin. Patient is planning outpatient circumcision   Qamar Aughenbaugh J. 02/13/2016, 4:15 PM

## 2015-10-01 LAB — OB RESULTS CONSOLE HEPATITIS B SURFACE ANTIGEN: HEP B S AG: NEGATIVE

## 2015-10-01 LAB — OB RESULTS CONSOLE ABO/RH: RH Type: POSITIVE

## 2015-10-01 LAB — OB RESULTS CONSOLE RPR: RPR: NONREACTIVE

## 2015-10-01 LAB — OB RESULTS CONSOLE ANTIBODY SCREEN: ANTIBODY SCREEN: NEGATIVE

## 2015-10-01 LAB — OB RESULTS CONSOLE RUBELLA ANTIBODY, IGM: RUBELLA: IMMUNE

## 2015-11-16 LAB — OB RESULTS CONSOLE HIV ANTIBODY (ROUTINE TESTING): HIV: NONREACTIVE

## 2016-01-21 LAB — OB RESULTS CONSOLE GBS: STREP GROUP B AG: POSITIVE

## 2016-02-12 ENCOUNTER — Inpatient Hospital Stay (HOSPITAL_COMMUNITY)
Admission: AD | Admit: 2016-02-12 | Discharge: 2016-02-12 | Disposition: A | Discharge status: Home / Self Care | Payer: Medicaid Other | Source: Ambulatory Visit | Attending: Obstetrics and Gynecology | Admitting: Obstetrics and Gynecology

## 2016-02-12 ENCOUNTER — Encounter (HOSPITAL_COMMUNITY): Payer: Self-pay | Admitting: *Deleted

## 2016-02-12 DIAGNOSIS — O471 False labor at or after 37 completed weeks of gestation: Secondary | ICD-10-CM

## 2016-02-12 LAB — URINE MICROSCOPIC-ADD ON: Bacteria, UA: NONE SEEN

## 2016-02-12 LAB — URINALYSIS, ROUTINE W REFLEX MICROSCOPIC
Bilirubin Urine: NEGATIVE
GLUCOSE, UA: NEGATIVE mg/dL
Ketones, ur: NEGATIVE mg/dL
Nitrite: NEGATIVE
Protein, ur: NEGATIVE mg/dL
Specific Gravity, Urine: 1.02 (ref 1.005–1.030)
pH: 7 (ref 5.0–8.0)

## 2016-02-12 LAB — COMPREHENSIVE METABOLIC PANEL
ALT: 15 U/L (ref 14–54)
AST: 20 U/L (ref 15–41)
Albumin: 3.1 g/dL — ABNORMAL LOW (ref 3.5–5.0)
Alkaline Phosphatase: 160 U/L — ABNORMAL HIGH (ref 38–126)
Anion gap: 8 (ref 5–15)
BUN: 10 mg/dL (ref 6–20)
CO2: 20 mmol/L — ABNORMAL LOW (ref 22–32)
CREATININE: 0.79 mg/dL (ref 0.44–1.00)
Calcium: 8.8 mg/dL — ABNORMAL LOW (ref 8.9–10.3)
Chloride: 108 mmol/L (ref 101–111)
GFR calc Af Amer: >60 mL/min (ref >60)
Glucose, Bld: 108 mg/dL — ABNORMAL HIGH (ref 65–99)
POTASSIUM: 3.8 mmol/L (ref 3.5–5.1)
Sodium: 136 mmol/L (ref 135–145)
TOTAL PROTEIN: 7 g/dL (ref 6.5–8.1)
Total Bilirubin: 0.1 mg/dL — ABNORMAL LOW (ref 0.3–1.2)

## 2016-02-12 LAB — CBC
HEMATOCRIT: 32.4 % — AB (ref 36.0–46.0)
Hemoglobin: 10.9 g/dL — ABNORMAL LOW (ref 12.0–15.0)
MCH: 28.1 pg (ref 26.0–34.0)
MCHC: 33.6 g/dL (ref 30.0–36.0)
MCV: 83.5 fL (ref 78.0–100.0)
Platelets: 180 10*3/uL (ref 150–400)
RBC: 3.88 MIL/uL (ref 3.87–5.11)
RDW: 15 % (ref 11.5–15.5)
WBC: 11.1 10*3/uL — AB (ref 4.0–10.5)

## 2016-02-12 MED ORDER — OXYCODONE-ACETAMINOPHEN 5-325 MG PO TABS: 1.0000 | ORAL_TABLET | Freq: Four times a day (QID) | ORAL | Status: DC | PRN

## 2016-02-12 NOTE — MAU Note (Signed)
States membranes were stripped in office yesterday. States she was told she was 3 cms. States she has felt bad pain, pressure, tightness in pelvis and back since this AM. No leaking or bleeding.

## 2016-02-12 NOTE — MAU Provider Note (Addendum)
DATE: 02/12/2016  Maternity Admissions Unit History and Physical Exam for an Obstetrics Patient  [name] is a 20 y.o. female, G1P0, at 3118w4d gestation, who presents for evaluation of labor. [facility]. She was seen in the office on 02/11/2016. Her blood pressure was normal at the time. Her urine protein was negative. The patient was 3 cm dilated. Her membranes were stripped. The patient now complains of pelvic pressure and bloody show.  OB History    Gravida Para Term Preterm AB TAB SAB Ectopic Multiple Living   1         0      Past Medical History  Diagnosis Date  . Trichomonal vaginitis     Prescriptions prior to admission  Medication Sig Dispense Refill Last Dose  . Prenatal Vit-Fe Fumarate-FA (PRENATAL MULTIVITAMIN) TABS tablet Take 1 tablet by mouth daily at 12 noon.   02/11/2016 at Unknown time  . metroNIDAZOLE (FLAGYL) 500 MG tablet Take 1 tablet (500 mg total) by mouth 2 (two) times daily. (Patient not taking: Reported on 02/12/2016) 14 tablet 0     Past Surgical History  Procedure Laterality Date  . Hernia repair      umbilical    No Known Allergies  Family History: family history includes Cancer in her paternal grandmother; Diabetes in her paternal grandmother. There is no history of Alcohol abuse, Arthritis, Asthma, Birth defects, COPD, Depression, Drug abuse, Early death, Hearing loss, Heart disease, Hyperlipidemia, Hypertension, Kidney disease, Learning disabilities, Mental illness, Mental retardation, Miscarriages / Stillbirths, Stroke, Vision loss, or Varicose Veins.  Social History:  reports that she has been smoking Cigarettes.  She has been smoking about 0.25 packs per day. She has never used smokeless tobacco. She reports that she uses illicit drugs (Marijuana). She reports that she does not drink alcohol.  Review of systems: Normal pregnancy  complaints.  Admission Physical Exam:  Dilation: 2.5 Effacement (%): 80 Station: -3 Exam by:: Sarajane MarekS. Carrera, RNC Body mass index is 37.89 kg/(m^2).  Blood pressure 142/89, pulse 102, temperature 98.8 F (37.1 C), temperature source Oral, resp. rate 18, height 5\' 7"  (1.702 m), weight 242 lb (109.77 kg), last menstrual period 05/11/2015.  HEENT:                 Within normal limits Chest:                   Clear Heart:                    Regular rate and rhythm Abdomen:             Gravid and nontender Extremities:          Grossly normal Neurologic exam: Grossly normal Pelvic exam:         Cervix: 2-3 cm dilated by the nurses exam  Prenatal labs: ABO, Rh:             --/--/O POS (10/20 1110)  NST: Category 1; Contractions: Mild and irregular .   Assessment:  518w4d gestation  Pelvic pressure  Bloody show  Elevated blood pressure  Plan:  We will check the patient's urine protein today.  We will repeat her blood pressure.  If there is no evidence of preeclampsia, then the patient will be discharged to home with plans to follow-up in the office next week. If there is evidence of preeclampsia  or pregnancy-induced hypertension, then we will consider induction of labor.   Janine LimboSTRINGER,Varsha Knock V 02/12/2016, 5:32 PM   The patient reports that she does have a headache. She also says that she has blurred vision. She adds that she has had headaches and blurred vision throughout the pregnancy. She denies right upper quadrant tenderness.  BP 132/75 mmHg  Pulse 98  Temp(Src) 98.8 F (37.1 C) (Oral)  Resp 18  Ht 5\' 7"  (1.702 m)  Wt 242 lb (109.77 kg)  BMI 37.89 kg/m2  LMP 05/11/2015   Fetal heart rate: Category 1  Abdomen: Nontender Extremities: 1+ edema Reflexes: Normal. No clonus. No evidence of DVT.  We will check a CBC and a comprehensive metabolic panel. Her urine protein is pending at this time.  Preeclampsia discussed. Questions were answered. The patient is not in  labor. Final plans will depend upon test results.  Dr. Stefano GaulStringer 02/12/2016 6:01 PM

## 2016-02-13 ENCOUNTER — Encounter (HOSPITAL_COMMUNITY): Payer: Self-pay

## 2016-02-13 ENCOUNTER — Inpatient Hospital Stay (HOSPITAL_COMMUNITY): Payer: Medicaid Other | Admitting: Anesthesiology

## 2016-02-13 ENCOUNTER — Inpatient Hospital Stay (HOSPITAL_COMMUNITY)
Admission: AD | Admit: 2016-02-13 | Discharge: 2016-02-15 | DRG: 774 | Disposition: A | Discharge status: Home / Self Care | Payer: Medicaid Other | Source: Ambulatory Visit | Attending: Obstetrics and Gynecology | Admitting: Obstetrics and Gynecology

## 2016-02-13 DIAGNOSIS — O0930 Supervision of pregnancy with insufficient antenatal care, unspecified trimester: Secondary | ICD-10-CM

## 2016-02-13 DIAGNOSIS — O9081 Anemia of the puerperium: Secondary | ICD-10-CM | POA: Diagnosis not present

## 2016-02-13 DIAGNOSIS — F17210 Nicotine dependence, cigarettes, uncomplicated: Secondary | ICD-10-CM | POA: Diagnosis present

## 2016-02-13 DIAGNOSIS — D649 Anemia, unspecified: Secondary | ICD-10-CM | POA: Diagnosis not present

## 2016-02-13 DIAGNOSIS — Z3A39 39 weeks gestation of pregnancy: Secondary | ICD-10-CM

## 2016-02-13 DIAGNOSIS — O4202 Full-term premature rupture of membranes, onset of labor within 24 hours of rupture: Secondary | ICD-10-CM | POA: Diagnosis present

## 2016-02-13 DIAGNOSIS — O99214 Obesity complicating childbirth: Secondary | ICD-10-CM | POA: Diagnosis present

## 2016-02-13 DIAGNOSIS — E669 Obesity, unspecified: Secondary | ICD-10-CM | POA: Diagnosis present

## 2016-02-13 DIAGNOSIS — O324XX0 Maternal care for high head at term, not applicable or unspecified: Secondary | ICD-10-CM | POA: Diagnosis present

## 2016-02-13 DIAGNOSIS — Z6837 Body mass index (BMI) 37.0-37.9, adult: Secondary | ICD-10-CM | POA: Diagnosis not present

## 2016-02-13 DIAGNOSIS — O99334 Smoking (tobacco) complicating childbirth: Secondary | ICD-10-CM | POA: Diagnosis present

## 2016-02-13 DIAGNOSIS — O99824 Streptococcus B carrier state complicating childbirth: Secondary | ICD-10-CM | POA: Diagnosis present

## 2016-02-13 DIAGNOSIS — O411230 Chorioamnionitis, third trimester, not applicable or unspecified: Secondary | ICD-10-CM | POA: Diagnosis present

## 2016-02-13 DIAGNOSIS — Z833 Family history of diabetes mellitus: Secondary | ICD-10-CM

## 2016-02-13 DIAGNOSIS — O99019 Anemia complicating pregnancy, unspecified trimester: Secondary | ICD-10-CM | POA: Diagnosis present

## 2016-02-13 LAB — CBC
HEMATOCRIT: 31.9 % — AB (ref 36.0–46.0)
HEMOGLOBIN: 10.7 g/dL — AB (ref 12.0–15.0)
MCH: 27.6 pg (ref 26.0–34.0)
MCHC: 33.5 g/dL (ref 30.0–36.0)
MCV: 82.4 fL (ref 78.0–100.0)
Platelets: 175 10*3/uL (ref 150–400)
RBC: 3.87 MIL/uL (ref 3.87–5.11)
RDW: 15.2 % (ref 11.5–15.5)
WBC: 12.7 10*3/uL — AB (ref 4.0–10.5)

## 2016-02-13 LAB — RPR: RPR Ser Ql: NONREACTIVE

## 2016-02-13 LAB — COMPREHENSIVE METABOLIC PANEL
ALK PHOS: 155 U/L — AB (ref 38–126)
ALT: 14 U/L (ref 14–54)
ANION GAP: 7 (ref 5–15)
AST: 22 U/L (ref 15–41)
Albumin: 3 g/dL — ABNORMAL LOW (ref 3.5–5.0)
BUN: 10 mg/dL (ref 6–20)
CALCIUM: 8.8 mg/dL — AB (ref 8.9–10.3)
CHLORIDE: 106 mmol/L (ref 101–111)
CO2: 21 mmol/L — AB (ref 22–32)
Creatinine, Ser: 0.85 mg/dL (ref 0.44–1.00)
GFR calc non Af Amer: >60 mL/min (ref >60)
Glucose, Bld: 146 mg/dL — ABNORMAL HIGH (ref 65–99)
Potassium: 3.9 mmol/L (ref 3.5–5.1)
SODIUM: 134 mmol/L — AB (ref 135–145)
Total Bilirubin: 0.3 mg/dL (ref 0.3–1.2)
Total Protein: 6.6 g/dL (ref 6.5–8.1)

## 2016-02-13 LAB — POCT FERN TEST: POCT FERN TEST: POSITIVE

## 2016-02-13 LAB — TYPE AND SCREEN
ABO/RH(D): O POS
ANTIBODY SCREEN: NEGATIVE

## 2016-02-13 LAB — PROTEIN / CREATININE RATIO, URINE: CREATININE, URINE: 44 mg/dL

## 2016-02-13 LAB — LACTATE DEHYDROGENASE: LDH: 154 U/L (ref 98–192)

## 2016-02-13 LAB — URIC ACID: URIC ACID, SERUM: 5.1 mg/dL (ref 2.3–6.6)

## 2016-02-13 MED ORDER — LACTATED RINGERS IV SOLN: INTRAVENOUS | Status: DC

## 2016-02-13 MED ORDER — PENICILLIN G POTASSIUM 5000000 UNITS IJ SOLR
5.0000 10*6.[IU] | Freq: Once | INTRAVENOUS | Status: AC
  Administered 2016-02-13: 5 10*6.[IU] via INTRAVENOUS
  Filled 2016-02-13 (×2): qty 5

## 2016-02-13 MED ORDER — OXYTOCIN BOLUS FROM INFUSION
500.0000 mL | INTRAVENOUS | Status: DC
  Administered 2016-02-13: 500 mL via INTRAVENOUS

## 2016-02-13 MED ORDER — PRENATAL MULTIVITAMIN CH
1.0000 | ORAL_TABLET | Freq: Every day | ORAL | Status: DC
  Administered 2016-02-14 – 2016-02-15 (×2): 1 via ORAL
  Filled 2016-02-13 (×2): qty 1

## 2016-02-13 MED ORDER — DEXTROSE 5 % IV SOLN
200.0000 mg | Freq: Three times a day (TID) | INTRAVENOUS | Status: DC
  Administered 2016-02-13 – 2016-02-14 (×3): 200 mg via INTRAVENOUS
  Filled 2016-02-13 (×5): qty 5

## 2016-02-13 MED ORDER — ACETAMINOPHEN 325 MG PO TABS
975.0000 mg | ORAL_TABLET | Freq: Once | ORAL | Status: AC
  Administered 2016-02-13: 975 mg via ORAL

## 2016-02-13 MED ORDER — PHENYLEPHRINE 40 MCG/ML (10ML) SYRINGE FOR IV PUSH (FOR BLOOD PRESSURE SUPPORT)
80.0000 ug | PREFILLED_SYRINGE | INTRAVENOUS | Status: DC | PRN
  Filled 2016-02-13: qty 5

## 2016-02-13 MED ORDER — FLEET ENEMA 7-19 GM/118ML RE ENEM: 1.0000 | ENEMA | RECTAL | Status: DC | PRN

## 2016-02-13 MED ORDER — OXYCODONE-ACETAMINOPHEN 5-325 MG PO TABS: 1.0000 | ORAL_TABLET | ORAL | Status: DC | PRN

## 2016-02-13 MED ORDER — WITCH HAZEL-GLYCERIN EX PADS
1.0000 | MEDICATED_PAD | CUTANEOUS | Status: DC | PRN
Start: 2016-02-13 — End: 2016-02-15

## 2016-02-13 MED ORDER — SOD CITRATE-CITRIC ACID 500-334 MG/5ML PO SOLN: 30.0000 mL | ORAL | Status: DC | PRN

## 2016-02-13 MED ORDER — SIMETHICONE 80 MG PO CHEW: 80.0000 mg | CHEWABLE_TABLET | ORAL | Status: DC | PRN

## 2016-02-13 MED ORDER — SENNOSIDES-DOCUSATE SODIUM 8.6-50 MG PO TABS
2.0000 | ORAL_TABLET | ORAL | Status: DC
  Administered 2016-02-14 (×2): 2 via ORAL
  Filled 2016-02-13 (×2): qty 2

## 2016-02-13 MED ORDER — SODIUM CHLORIDE 0.9 % IV SOLN
2.0000 g | Freq: Four times a day (QID) | INTRAVENOUS | Status: DC
  Administered 2016-02-13 – 2016-02-14 (×4): 2 g via INTRAVENOUS
  Filled 2016-02-13 (×5): qty 2000

## 2016-02-13 MED ORDER — OXYCODONE-ACETAMINOPHEN 5-325 MG PO TABS: 2.0000 | ORAL_TABLET | ORAL | Status: DC | PRN

## 2016-02-13 MED ORDER — IBUPROFEN 600 MG PO TABS
600.0000 mg | ORAL_TABLET | Freq: Four times a day (QID) | ORAL | Status: DC
  Administered 2016-02-13 – 2016-02-15 (×8): 600 mg via ORAL
  Filled 2016-02-13 (×8): qty 1

## 2016-02-13 MED ORDER — LIDOCAINE HCL (PF) 1 % IJ SOLN
30.0000 mL | INTRAMUSCULAR | Status: DC | PRN
  Filled 2016-02-13: qty 30

## 2016-02-13 MED ORDER — MISOPROSTOL 200 MCG PO TABS
ORAL_TABLET | ORAL | Status: AC
  Filled 2016-02-13: qty 1

## 2016-02-13 MED ORDER — HYDRALAZINE HCL 20 MG/ML IJ SOLN: 10.0000 mg | Freq: Once | INTRAMUSCULAR | Status: DC | PRN

## 2016-02-13 MED ORDER — DIBUCAINE 1 % RE OINT
1.0000 | TOPICAL_OINTMENT | RECTAL | Status: DC | PRN
Start: 2016-02-13 — End: 2016-02-15

## 2016-02-13 MED ORDER — DIPHENHYDRAMINE HCL 50 MG/ML IJ SOLN: 12.5000 mg | INTRAMUSCULAR | Status: DC | PRN

## 2016-02-13 MED ORDER — NALBUPHINE HCL 10 MG/ML IJ SOLN: 10.0000 mg | INTRAMUSCULAR | Status: DC | PRN

## 2016-02-13 MED ORDER — EPHEDRINE 5 MG/ML INJ
10.0000 mg | INTRAVENOUS | Status: DC | PRN
Start: 2016-02-13 — End: 2016-02-13
  Filled 2016-02-13: qty 2

## 2016-02-13 MED ORDER — METHYLERGONOVINE MALEATE 0.2 MG PO TABS: 0.2000 mg | ORAL_TABLET | ORAL | Status: DC | PRN

## 2016-02-13 MED ORDER — FENTANYL 2.5 MCG/ML BUPIVACAINE 1/10 % EPIDURAL INFUSION (WH - ANES)
14.0000 mL/h | INTRAMUSCULAR | Status: DC | PRN
  Administered 2016-02-13 (×2): 14 mL/h via EPIDURAL
  Filled 2016-02-13 (×2): qty 125

## 2016-02-13 MED ORDER — LACTATED RINGERS IV SOLN
INTRAVENOUS | Status: DC
  Administered 2016-02-13: 03:00:00 via INTRAVENOUS

## 2016-02-13 MED ORDER — METHYLERGONOVINE MALEATE 0.2 MG/ML IJ SOLN: 0.2000 mg | INTRAMUSCULAR | Status: DC | PRN

## 2016-02-13 MED ORDER — MISOPROSTOL 200 MCG PO TABS
ORAL_TABLET | ORAL | Status: AC
  Administered 2016-02-13: 1000 ug
  Filled 2016-02-13: qty 5

## 2016-02-13 MED ORDER — COCONUT OIL OIL: 1.0000 "application " | TOPICAL_OIL | Status: DC | PRN

## 2016-02-13 MED ORDER — PENICILLIN G POTASSIUM 5000000 UNITS IJ SOLR
2.5000 10*6.[IU] | INTRAVENOUS | Status: DC
  Administered 2016-02-13 (×2): 2.5 10*6.[IU] via INTRAVENOUS
  Filled 2016-02-13 (×6): qty 2.5

## 2016-02-13 MED ORDER — ACETAMINOPHEN 325 MG PO TABS
650.0000 mg | ORAL_TABLET | ORAL | Status: DC | PRN
Start: 2016-02-13 — End: 2016-02-13
  Filled 2016-02-13: qty 2

## 2016-02-13 MED ORDER — LABETALOL HCL 5 MG/ML IV SOLN: 20.0000 mg | INTRAVENOUS | Status: DC | PRN

## 2016-02-13 MED ORDER — DIPHENHYDRAMINE HCL 25 MG PO CAPS
25.0000 mg | ORAL_CAPSULE | Freq: Four times a day (QID) | ORAL | Status: DC | PRN
Start: 2016-02-13 — End: 2016-02-15

## 2016-02-13 MED ORDER — PHENYLEPHRINE 40 MCG/ML (10ML) SYRINGE FOR IV PUSH (FOR BLOOD PRESSURE SUPPORT)
80.0000 ug | PREFILLED_SYRINGE | INTRAVENOUS | Status: DC | PRN
  Filled 2016-02-13: qty 5
  Filled 2016-02-13: qty 10

## 2016-02-13 MED ORDER — ONDANSETRON HCL 4 MG PO TABS: 4.0000 mg | ORAL_TABLET | ORAL | Status: DC | PRN

## 2016-02-13 MED ORDER — OXYCODONE HCL 5 MG PO TABS: 10.0000 mg | ORAL_TABLET | ORAL | Status: DC | PRN

## 2016-02-13 MED ORDER — EPHEDRINE 5 MG/ML INJ
10.0000 mg | INTRAVENOUS | Status: DC | PRN
  Filled 2016-02-13: qty 2

## 2016-02-13 MED ORDER — BENZOCAINE-MENTHOL 20-0.5 % EX AERO
1.0000 "application " | INHALATION_SPRAY | CUTANEOUS | Status: DC | PRN
  Administered 2016-02-13: 1 via TOPICAL
  Filled 2016-02-13: qty 56

## 2016-02-13 MED ORDER — OXYCODONE HCL 5 MG PO TABS: 5.0000 mg | ORAL_TABLET | ORAL | Status: DC | PRN

## 2016-02-13 MED ORDER — ONDANSETRON HCL 4 MG/2ML IJ SOLN: 4.0000 mg | Freq: Four times a day (QID) | INTRAMUSCULAR | Status: DC | PRN

## 2016-02-13 MED ORDER — ZOLPIDEM TARTRATE 5 MG PO TABS: 5.0000 mg | ORAL_TABLET | Freq: Every evening | ORAL | Status: DC | PRN

## 2016-02-13 MED ORDER — LIDOCAINE HCL (PF) 1 % IJ SOLN
INTRAMUSCULAR | Status: DC | PRN
Start: 2016-02-13 — End: 2016-02-13
  Administered 2016-02-13 (×2): 5 mL

## 2016-02-13 MED ORDER — TERBUTALINE SULFATE 1 MG/ML IJ SOLN
0.2500 mg | Freq: Once | INTRAMUSCULAR | Status: DC | PRN
  Filled 2016-02-13: qty 1

## 2016-02-13 MED ORDER — LACTATED RINGERS IV SOLN: 500.0000 mL | INTRAVENOUS | Status: DC | PRN

## 2016-02-13 MED ORDER — LACTATED RINGERS IV SOLN: 500.0000 mL | Freq: Once | INTRAVENOUS | Status: DC

## 2016-02-13 MED ORDER — ONDANSETRON HCL 4 MG/2ML IJ SOLN
4.0000 mg | INTRAMUSCULAR | Status: DC | PRN
Start: 2016-02-13 — End: 2016-02-15

## 2016-02-13 MED ORDER — OXYTOCIN 40 UNITS IN LACTATED RINGERS INFUSION - SIMPLE MED
1.0000 m[IU]/min | INTRAVENOUS | Status: DC
  Administered 2016-02-13: 2 m[IU]/min via INTRAVENOUS

## 2016-02-13 MED ORDER — ACETAMINOPHEN 325 MG PO TABS: 650.0000 mg | ORAL_TABLET | ORAL | Status: DC | PRN

## 2016-02-13 MED ORDER — OXYTOCIN 40 UNITS IN LACTATED RINGERS INFUSION - SIMPLE MED
2.5000 [IU]/h | INTRAVENOUS | Status: DC
  Filled 2016-02-13: qty 1000

## 2016-02-13 MED ORDER — PNEUMOCOCCAL VAC POLYVALENT 25 MCG/0.5ML IJ INJ
0.5000 mL | INJECTION | INTRAMUSCULAR | Status: AC
  Administered 2016-02-14: 0.5 mL via INTRAMUSCULAR
  Filled 2016-02-13 (×2): qty 0.5

## 2016-02-13 NOTE — Progress Notes (Signed)
Subjective: Postpartum Day 1: Vaginal delivery, 1st degree laceration.  Uterine atony treated with 1000 mcg cytotech 20 min after delivery with resolution. Patient up ad lib, reports no syncope or dizziness. Feeding:  Breast/bottle Contraceptive plan:  Plans abstinence.  Plans outpatient circumcision.  Objective: Vital signs in last 24 hours: Temp:  [97.9 F (36.6 C)-102.1 F (38.9 C)] 97.9 F (36.6 C) (06/25 0521) Pulse Rate:  [66-107] 66 (06/25 0521) Resp:  [16-20] 18 (06/25 0521) BP: (97-145)/(59-106) 129/73 mmHg (06/25 0521) SpO2:  [100 %] 100 % (06/24 2225)   Filed Vitals:   02/13/16 1900 02/13/16 2225 02/14/16 0006 02/14/16 0521  BP: 140/71 134/69 97/68 129/73  Pulse: 84 78 73 66  Temp: 100.2 F (37.9 C) 99.3 F (37.4 C) 98 F (36.7 C) 97.9 F (36.6 C)  TempSrc:  Oral Oral Oral  Resp: 18 18 18 18   Height:      Weight:      SpO2: 100% 100%    Orthostatics stable  Tmax 102.1 at 1745.  Physical Exam:  General: alert Lochia: appropriate Uterine Fundus: firm Perineum: healing well DVT Evaluation: No evidence of DVT seen on physical exam. Negative Homan's sign.  Marland Kitchen. ampicillin (OMNIPEN) IV  2 g Intravenous Q6H  . gentamicin  200 mg Intravenous Q8H  . ibuprofen  600 mg Oral Q6H  . pneumococcal 23 valent vaccine  0.5 mL Intramuscular Tomorrow-1000  . prenatal multivitamin  1 tablet Oral Q1200  . senna-docusate  2 tablet Oral Q24H   Has received 2 doses of Amp and 2 doses of Gent since delivery.  CBC Latest Ref Rng 02/14/2016 02/13/2016 02/12/2016  WBC 4.0 - 10.5 K/uL 16.0(H) 12.7(H) 11.1(H)  Hemoglobin 12.0 - 15.0 g/dL 9.0(L) 10.7(L) 10.9(L)  Hematocrit 36.0 - 46.0 % 27.2(L) 31.9(L) 32.4(L)  Platelets 150 - 400 K/uL 158 175 180     Assessment/Plan: Status post vaginal delivery day 1. Gestational hypertension--normotensive PP fever--on Amp and Gent x 24 hours post delivery Anemia--hemodynamically stable. Stable Continue current care. Fe q day Plan for  discharge tomorrow  Reviewed contraceptive options--declines at present, plans abstinence.    Nyra CapesLATHAM, VICKICNM 02/14/2016, 6:53 AM

## 2016-02-13 NOTE — MAU Note (Signed)
Urine sent to lab 

## 2016-02-13 NOTE — MAU Note (Signed)
Was 3 cm earlier here.  Contractions stronger.  May be leaking since midnight. Clear, Watery and mucus like.  Baby moving well.  No bleeding.

## 2016-02-13 NOTE — H&P (Signed)
Karen Frederick is a 20 y.o. female, G1P0 presenting for worsening ctxs and ? SROM (clear fluid since midnight). +FM, some bloody show. Was here earlier due to ctxs. BPs noted to be elevated but preE labs WNL. Denies HA, visual changes, epigastric pain or difficulty breathing.   Maternal Medical History:  Reason for admission: Rupture of membranes and contractions.  Nausea.  Contractions: Onset was less than 1 hour ago.   Frequency: regular.   Duration is approximately 40 seconds.   Perceived severity is moderate.    Fetal activity: Perceived fetal activity is normal.   Last perceived fetal movement was within the past hour.    Prenatal complications: No bleeding.     OB History    Gravida Para Term Preterm AB TAB SAB Ectopic Multiple Living   1         0     Past Medical History  Diagnosis Date  . Trichomonal vaginitis    Past Surgical History  Procedure Laterality Date  . Hernia repair      umbilical   Family History: family history includes Cancer in her paternal grandmother; Diabetes in her paternal grandmother. There is no history of Alcohol abuse, Arthritis, Asthma, Birth defects, COPD, Depression, Drug abuse, Early death, Hearing loss, Heart disease, Hyperlipidemia, Hypertension, Kidney disease, Learning disabilities, Mental illness, Mental retardation, Miscarriages / Stillbirths, Stroke, Vision loss, or Varicose Veins. Social History:  reports that she has been smoking Cigarettes.  She has been smoking about 0.25 packs per day. She has never used smokeless tobacco. She reports that she uses illicit drugs (Marijuana). She reports that she does not drink alcohol.   Prenatal Transfer Tool  Maternal Diabetes: No Genetic Screening: Missed window Maternal Ultrasounds/Referrals: Normal Fetal Ultrasounds or other Referrals:  None Maternal Substance Abuse:  No --- per pt report, has not smoked marijuana since high school (none during pregnancy) Significant Maternal Medications:   Meds include: Other:  PNV Significant Maternal Lab Results:  Lab values include: Group B Strep positive Other Comments:  None  Review of Systems  Gastrointestinal: Negative for nausea and vomiting.    Dilation: 4 Effacement (%): 90 Station: -2 Exam by:: Benji sTanley RN Blood pressure 133/73, pulse 105, temperature 98.3 F (36.8 C), temperature source Oral, resp. rate 20, last menstrual period 05/11/2015, SpO2 98 %. Maternal Exam:  Uterine Assessment: Contraction strength is moderate.  Contraction duration is 50 seconds. Contraction frequency is irregular.   Abdomen: Patient reports no abdominal tenderness. Fundal height is CWD.   Estimated fetal weight is 7 lbs.   Fetal presentation: vertex  Introitus: Normal vulva. Normal vagina.  Ferning test: positive.  Nitrazine test: not done. Amniotic fluid character: clear.  Pelvis: adequate for delivery.   Cervix: Cervix evaluated by digital exam.     Fetal Exam Fetal Monitor Review: Mode: fetoscope.   Variability: moderate (6-25 bpm).   Pattern: no decelerations and accelerations present.    Fetal State Assessment: Category I - tracings are normal.     Physical Exam  Constitutional: She appears well-developed and well-nourished.  HENT:  Head: Normocephalic and atraumatic.  Neck: Normal range of motion. Neck supple.  Cardiovascular: Normal rate and regular rhythm.  Exam reveals no gallop and no friction rub.   No murmur heard. Respiratory: Breath sounds normal.  GI: Bowel sounds are normal. She exhibits no distension and no mass. There is no tenderness. There is no rebound and no guarding.    Prenatal labs: ABO, Rh: O/Positive/-- (02/09 0000) Antibody:  Negative (02/09 0000) Rubella: Immune (02/09 0000) RPR: Nonreactive (02/09 0000)  HBsAg: Negative (02/09 0000)  HIV: Non-reactive (03/27 0000)  GBS: Positive (06/01 0000)  Results for orders placed or performed during the hospital encounter of 02/13/16 (from the past 24  hour(s))  Protein / creatinine ratio, urine     Status: None   Collection Time: 02/13/16  1:40 AM  Result Value Ref Range   Creatinine, Urine 44.00 mg/dL   Total Protein, Urine <6 mg/dL   Protein Creatinine Ratio        0.00 - 0.15 mg/mg[Cre]  Fern Test     Status: None   Collection Time: 02/13/16  2:23 AM  Result Value Ref Range   POCT Fern Test Positive = ruptured amniotic membanes   CBC     Status: Abnormal   Collection Time: 02/13/16  2:35 AM  Result Value Ref Range   WBC 12.7 (H) 4.0 - 10.5 K/uL   RBC 3.87 3.87 - 5.11 MIL/uL   Hemoglobin 10.7 (L) 12.0 - 15.0 g/dL   HCT 16.131.9 (L) 09.636.0 - 04.546.0 %   MCV 82.4 78.0 - 100.0 fL   MCH 27.6 26.0 - 34.0 pg   MCHC 33.5 30.0 - 36.0 g/dL   RDW 40.915.2 81.111.5 - 91.415.5 %   Platelets 175 150 - 400 K/uL  Type and screen [facility]     Status: None   Collection Time: 02/13/16  2:35 AM  Result Value Ref Range   ABO/RH(D) O POS    Antibody Screen NEG    Sample Expiration 02/16/2016   Lactate dehydrogenase     Status: None   Collection Time: 02/13/16  2:35 AM  Result Value Ref Range   LDH 154 98 - 192 U/L  Comprehensive metabolic panel     Status: Abnormal   Collection Time: 02/13/16  2:35 AM  Result Value Ref Range   Sodium 134 (L) 135 - 145 mmol/L   Potassium 3.9 3.5 - 5.1 mmol/L   Chloride 106 101 - 111 mmol/L   CO2 21 (L) 22 - 32 mmol/L   Glucose, Bld 146 (H) 65 - 99 mg/dL   BUN 10 6 - 20 mg/dL   Creatinine, Ser 7.820.85 0.44 - 1.00 mg/dL   Calcium 8.8 (L) 8.9 - 10.3 mg/dL   Total Protein 6.6 6.5 - 8.1 g/dL   Albumin 3.0 (L) 3.5 - 5.0 g/dL   AST 22 15 - 41 U/L   ALT 14 14 - 54 U/L   Alkaline Phosphatase 155 (H) 38 - 126 U/L   Total Bilirubin 0.3 0.3 - 1.2 mg/dL   GFR calc non Af Amer >60 >60 mL/min   GFR calc Af Amer >60 >60 mL/min   Anion gap 7 5 - 15  Uric acid     Status: None   Collection Time: 02/13/16  2:35 AM  Result Value Ref Range   Uric Acid, Serum 5.1 2.3 - 6.6 mg/dL   Assessment: IUP at term SROM x 2  1/2 hrs; no s/s of infection Cat 2 FHRT (recent Percocet for pain) Latent labor GBS positive Elevated BPs Obesity  [facility]: [facility] orders - preE labs w/ admission labs Intrauterine resuscitative measures prn Epidural upon request IV antihypertensives for severe range pressures Risks and benefits of Pitocin augmentation reviewed, including failure of method, prolonged labor, need for further intervention, and risk of cesarean. PCN G for GBS prophylaxis per standard dosing with ROM or active labor  Consult as indicated Expect progress and SVD   Sherre Scarlet 02/13/2016, 2:30 AM

## 2016-02-13 NOTE — Anesthesia Procedure Notes (Signed)
Epidural Patient location during procedure: OB  Staffing Anesthesiologist: Kiara Mcdowell Performed by: anesthesiologist   Preanesthetic Checklist Completed: patient identified, site marked, surgical consent, pre-op evaluation, timeout performed, IV checked, risks and benefits discussed and monitors and equipment checked  Epidural Patient position: sitting Prep: DuraPrep Patient monitoring: heart rate, continuous pulse ox and blood pressure Approach: right paramedian Location: L3-L4 Injection technique: LOR saline  Needle:  Needle type: Tuohy  Needle gauge: 17 G Needle length: 9 cm and 9 Needle insertion depth: 6 cm Catheter type: closed end flexible Catheter size: 20 Guage Catheter at skin depth: 10 cm Test dose: negative  Assessment Events: blood not aspirated, injection not painful, no injection resistance, negative IV test and no paresthesia  Additional Notes Patient identified. Risks/Benefits/Options discussed with patient including but not limited to bleeding, infection, nerve damage, paralysis, failed block, incomplete pain control, headache, blood pressure changes, nausea, vomiting, reactions to medication both or allergic, itching and postpartum back pain. Confirmed with bedside nurse the patient's most recent platelet count. Confirmed with patient that they are not currently taking any anticoagulation, have any bleeding history or any family history of bleeding disorders. Patient expressed understanding and wished to proceed. All questions were answered. Sterile technique was used throughout the entire procedure. Please see nursing notes for vital signs. Test dose was given through epidural needle and negative prior to continuing to dose epidural or start infusion. Warning signs of high block given to the patient including shortness of breath, tingling/numbness in hands, complete motor block, or any concerning symptoms with instructions to call for help. Patient was given  instructions on fall risk and not to get out of bed. All questions and concerns addressed with instructions to call with any issues.   

## 2016-02-13 NOTE — Progress Notes (Signed)
ANTIBIOTIC CONSULT NOTE - INITIAL  Pharmacy Consult for Gentamicin Indication: Maternal fever  No Known Allergies  Patient Measurements: Height: 5\' 7"  (170.2 cm) Weight: 237 lb (107.502 kg) IBW/kg (Calculated) : 61.6 Adjusted Body Weight: 75.4  Vital Signs: Temp: 100.4 F (38 C) (06/24 1330) Temp Source: Oral (06/24 1330) BP: 138/83 mmHg (06/24 1329) Pulse Rate: 104 (06/24 1329)  Labs:  Recent Labs  02/12/16 1758 02/13/16 0140 02/13/16 0235  WBC 11.1*  --  12.7*  HGB 10.9*  --  10.7*  PLT 180  --  175  LABCREA  --  44.00  --   CREATININE 0.79  --  0.85   No results for input(s): GENTTROUGH, GENTPEAK, GENTRANDOM in the last 72 hours.   Microbiology: Recent Results (from the past 720 hour(s))  OB RESULT CONSOLE Group B Strep     Status: None   Collection Time: 01/21/16 12:00 AM  Result Value Ref Range Status   GBS Positive  Final    Medications:  Penicillin 2.5 million units q6hrs  Assessment: 20 y.o. female G1P0 at 599w5d on gentamicin for maternal fever.  Estimated Ke = 0.308 , Vd = 30.1L  Goal of Therapy:  Gentamicin peak 6-8 mg/L and Trough < 1 mg/L  Plan:  Gentamicin 200 mg IV every 8 hrs  Check Scr with next labs if gentamicin continued. Will check gentamicin levels if continued > 72hr or clinically indicated.  Karen Frederick, Karen Frederick 02/13/2016,1:40 PM

## 2016-02-13 NOTE — Anesthesia Preprocedure Evaluation (Signed)
Anesthesia Evaluation  Patient identified by MRN, date of birth, ID band Patient awake    Reviewed: Allergy & Precautions, H&P , NPO status , Patient's Chart, lab work & pertinent test results  History of Anesthesia Complications Negative for: history of anesthetic complications  Airway Mallampati: II  TM Distance: >3 FB Neck ROM: full    Dental no notable dental hx. (+) Teeth Intact   Pulmonary neg pulmonary ROS, Current Smoker,    Pulmonary exam normal breath sounds clear to auscultation       Cardiovascular negative cardio ROS Normal cardiovascular exam Rhythm:regular Rate:Normal     Neuro/Psych negative neurological ROS  negative psych ROS   GI/Hepatic negative GI ROS, Neg liver ROS,   Endo/Other  negative endocrine ROS  Renal/GU negative Renal ROS  negative genitourinary   Musculoskeletal   Abdominal   Peds  Hematology negative hematology ROS (+)   Anesthesia Other Findings   Reproductive/Obstetrics (+) Pregnancy                             Anesthesia Physical Anesthesia Plan  ASA: II  Anesthesia Plan: Epidural   Post-op Pain Management:    Induction:   Airway Management Planned:   Additional Equipment:   Intra-op Plan:   Post-operative Plan:   Informed Consent: I have reviewed the patients History and Physical, chart, labs and discussed the procedure including the risks, benefits and alternatives for the proposed anesthesia with the patient or authorized representative who has indicated his/her understanding and acceptance.     Plan Discussed with:   Anesthesia Plan Comments:         Anesthesia Quick Evaluation  

## 2016-02-13 NOTE — Progress Notes (Signed)
Andersen Genene ChurnM Mccarney is a 20 y.o. G1P0 at 7179w5d  admitted for rupture of membranes  Subjective: Patient complaining of periumbilical pain on the left side.   Objective: BP 136/78 mmHg  Pulse 90  Temp(Src) 98.5 F (36.9 C) (Oral)  Resp 18  Ht 5\' 7"  (1.702 m)  Wt 237 lb (107.502 kg)  BMI 37.11 kg/m2  SpO2 98%  LMP 05/11/2015   Total I/O In: -  Out: 700 [Urine:700]  FHT:  FHR: 140 bpm, variability: moderate,  accelerations:  Present,  decelerations:  Present variable UC:   regular, every 2-3 minutes SVE:   Dilation: 8 Effacement (%): 90 Station: 0 Exam by:: Henderson NewcomerStephanie Faulk, RN  Labs: Lab Results  Component Value Date   WBC 12.7* 02/13/2016   HGB 10.7* 02/13/2016   HCT 31.9* 02/13/2016   MCV 82.4 02/13/2016   PLT 175 02/13/2016    Assessment / Plan: Augmentation of labor, progressing well  Labor: progressing on pitocin  Preeclampsia:  NA Fetal Wellbeing:  Category I Pain Control:  Epidural I/D:  penicillin Anticipated MOD:  NSVD  Marvyn Torrez J. 02/13/2016, 10:12 AM

## 2016-02-13 NOTE — Progress Notes (Signed)
  Subjective: Comfortable w/ epidural. States very hungry. +FM. Denies bleeding.  Objective: BP 113/66 mmHg  Pulse 92  Temp(Src) 98.3 F (36.8 C) (Oral)  Resp 18  Ht 5\' 7"  (1.702 m)  Wt 107.502 kg (237 lb)  BMI 37.11 kg/m2  SpO2 98%  LMP 05/11/2015 Today's Vitals   02/13/16 0441 02/13/16 0501 02/13/16 0531 02/13/16 0601  BP: 135/73 108/61 109/62 113/66  Pulse: 98 99 93 92  Temp:      TempSrc:      Resp: 18     Height:      Weight:      SpO2:      PainSc:       FHT: BL 150 w/ min-mod variability, +accels, no decels now (had 1 1/2 min late decel at 0457 to nadir 70 bpm and from [id number] to nadir 70 bpm - both recovered w/ position change only) UC:   irregular SVE:   Dilation: 4 Effacement (%): 80 Station: -2 Exam by:: Thornell MuleK Christol Thetford VWU@ [id number]@ 0632  AROM'd forebag at [phone], clear fluid  IUPC placed at [phone]  Assessment:  IUP at term Overall reassuring FHRT GBS positive Obesity No cervical change  Plan: Begin Pitocin augmentation Continue intrauterine resuscitative measures prn Expect progress  Sherre ScarletWILLIAMS, Jaclin Finks CNM 02/13/2016, 6:40 AM

## 2016-02-13 NOTE — Progress Notes (Signed)
Karen Frederick is a 20 y.o. G1P0 at 662w5d by admitted due to SROM Subjective: Called to see patient by nurse she is complaining of exhaustion and tired of pushing . Very little descent in the last hour of pushing.   Objective: BP 145/63 mmHg  Pulse 92  Temp(Src) 100.4 F (38 C) (Oral)  Resp 18  Ht 5\' 7"  (1.702 m)  Wt 237 lb (107.502 kg)  BMI 37.11 kg/m2  SpO2 98%  LMP 05/11/2015   Total I/O In: -  Out: 700 [Urine:700]  FHT:  FHR: 150's bpm, variability: moderate,  accelerations:  Present,  decelerations:  Present variables  UC:   regular, every 2 minutes SVE:    Complete +1 station with caput.Marland Kitchen. Poor maternal effort with pushing.  Labs: Lab Results  Component Value Date   WBC 12.7* 02/13/2016   HGB 10.7* 02/13/2016   HCT 31.9* 02/13/2016   MCV 82.4 02/13/2016   PLT 175 02/13/2016    Assessment / Plan: 39 wjks and 5 days with arrest of descent . Pt has been pushing for 2 hours... station +1 with caput... I am uncomfortable placing vacuum due to concern for shoulder dystocia.  I discussed with patient continuing to push vs cesarean section. R/o cesarean section discussed. Including infection/ bleeding / damage to bowel bladder and baby with the need for further surgery. R/o Transfusion HIV/ Hep B&C discussed. Pt voiced understanding. I encouraged her to rest for 15 minutes before making her final decision.  Karen Frederick J. 02/13/2016, 2:26 PM

## 2016-02-14 LAB — CBC
HCT: 27.2 % — ABNORMAL LOW (ref 36.0–46.0)
Hemoglobin: 9 g/dL — ABNORMAL LOW (ref 12.0–15.0)
MCH: 27.4 pg (ref 26.0–34.0)
MCHC: 33.1 g/dL (ref 30.0–36.0)
MCV: 82.7 fL (ref 78.0–100.0)
PLATELETS: 158 10*3/uL (ref 150–400)
RBC: 3.29 MIL/uL — ABNORMAL LOW (ref 3.87–5.11)
RDW: 15 % (ref 11.5–15.5)
WBC: 16 10*3/uL — AB (ref 4.0–10.5)

## 2016-02-14 MED ORDER — FERROUS SULFATE 325 (65 FE) MG PO TABS
325.0000 mg | ORAL_TABLET | Freq: Every day | ORAL | Status: DC
  Administered 2016-02-14 – 2016-02-15 (×2): 325 mg via ORAL
  Filled 2016-02-14 (×2): qty 1

## 2016-02-14 NOTE — Anesthesia Postprocedure Evaluation (Signed)
Anesthesia Post Note  Patient: Hospital doctorAmber M Frederick  Procedure(s) Performed: * No procedures listed *  Patient location during evaluation: Mother Baby Anesthesia Type: Epidural Level of consciousness: oriented and awake and alert Pain management: pain level controlled Vital Signs Assessment: post-procedure vital signs reviewed and stable Respiratory status: spontaneous breathing and nonlabored ventilation Cardiovascular status: stable Postop Assessment: epidural receding, patient able to bend at knees, no signs of nausea or vomiting and adequate PO intake Anesthetic complications: no     Last Vitals:  Filed Vitals:   02/14/16 0006 02/14/16 0521  BP: 97/68 129/73  Pulse: 73 66  Temp: 36.7 C 36.6 C  Resp: 18 18    Last Pain:  Filed Vitals:   02/14/16 0523  PainSc: 0-No pain   Pain Goal: Patients Stated Pain Goal: 1 (02/13/16 1845)               Laban EmperorMalinova,[name]

## 2016-02-14 NOTE — Lactation Note (Signed)
This note was copied from a baby's chart. Lactation Consultation Note; Mom reports breast feeding hurts. She has been giving bottles of formula. Wants to continue trying to breast feed baby. He had 20 ml of formula about 1 hour ago and is asleep at present. Encouraged to call for assist with deeper latch when baby showing feeding cues.  Has manual pump at bedside, reports she has pumped but only got a few drops. Reassurance given. BF brochure given with resources for support after DC. No questions at present.   Patient Name: Boy Janan Ridgember Sternberg ZOXWR'UToday's Date: 02/14/2016 Reason for consult: Initial assessment   Maternal Data Formula Feeding for Exclusion: Yes Reason for exclusion: Mother's choice to formula and breast feed on admission Does the patient have breastfeeding experience prior to this delivery?: No  Feeding Length of feed: 10 min  LATCH Score/Interventions                      Lactation Tools Discussed/Used     Consult Status Consult Status: Follow-up Date: 02/14/16 Follow-up type: In-patient    Pamelia HoitWeeks, Makiyla Linch D 02/14/2016, 10:42 AM

## 2016-02-15 MED ORDER — TETANUS-DIPHTH-ACELL PERTUSSIS 5-2.5-18.5 LF-MCG/0.5 IM SUSP
0.5000 mL | Freq: Once | INTRAMUSCULAR | Status: AC
  Administered 2016-02-15: 0.5 mL via INTRAMUSCULAR
  Filled 2016-02-15: qty 0.5

## 2016-02-15 MED ORDER — IBUPROFEN 600 MG PO TABS: 600.0000 mg | ORAL_TABLET | Freq: Four times a day (QID) | ORAL | Status: DC

## 2016-02-15 MED ORDER — OXYCODONE HCL 5 MG PO TABS
5.0000 mg | ORAL_TABLET | ORAL | Status: DC | PRN
Start: 2016-02-15 — End: 2017-07-12

## 2016-02-15 NOTE — Clinical Social Work Maternal (Signed)
  CLINICAL SOCIAL WORK MATERNAL/CHILD NOTE  Patient Details  Name: Karen Frederick MRN: [id number] Date of Birth: [birth year]  Date:  02/15/2016  Clinical Social Worker Initiating Note:  Loren Racer, [place] Date/ Time Initiated:  02/15/16/1034     Child's Name:  Lester [place]   Legal Guardian:  Mother  FOB is Tre'Shaun Hargrove  Need for Interpreter:  None   Date of Referral:  02/13/16     Reason for Referral:  Late or No Prenatal Care  care started at 21 weeks due to waiting on medicaid. H/O THC use in 2016.   Referral Source:  [facility]   Address:  [place], [place]  Phone number:  [phone]   Household Members:  Self, Relatives, Siblings   Natural Supports (not living in the home):  Extended Family, Spouse/significant other, Community, Chief Executive Officer Supports:   Pediatrician- [place]  Employment: Ship broker at [employer] Work: MOB going to school for [employer], FOB works fulltime in a Proofreader.   Education:  Chiropractor Resources:  Medicaid   Other Resources:  [facility]   Cultural/Religious Considerations Which May Impact Care:  none noted  Strengths:  Ability to meet basic needs , Home prepared for child , Pediatrician chosen    Risk Factors/Current Problems:  None   Cognitive State:  Goal Oriented , Insightful , Linear Thinking    Mood/Affect:  Bright , Calm , Relaxed    CSW Assessment:  CSW met in postpartum room 118 with MOB, FOB and MGM and baby. MOB open to CSW discussing concrens of LPC and possible THC use in front of family. MOB reports she was waiting on her medicaid to start and was still within [facility] protocol of 28 weeks, starting care at 21 weeks. CSW discussed past h/o THC use and this was prior to pregnancy. MOB denies use during pregnancy or recent use. CSW educated family about [facility] drug screen policy/protocol and that baby's UDS was negative. MOB  stating understanding and thought that cord blood would also be negative. Family appears well prepared, has transportation, connection with local resources, bonding well and has no barriers to d/c noted at this time. CSW team to review cord blood drug screen results and act accordingly when available.  `  CSW Plan/Description:  No Further Intervention Required/No Barriers to Discharge, Patient/Family Education     Armstead Peaks, [place] 02/15/2016, 10:38 AM

## 2016-02-15 NOTE — Discharge Instructions (Signed)

## 2016-02-15 NOTE — Discharge Summary (Signed)
Obstetric Discharge Summary Reason for Admission: onset of labor Prenatal Procedures: ultrasound Intrapartum Procedures: spontaneous vaginal delivery Postpartum Procedures: antibiotics Complications-Operative and Postpartum: none HEMOGLOBIN  Date Value Ref Range Status  02/14/2016 9.0* 12.0 - 15.0 g/dL Final   HCT  Date Value Ref Range Status  02/14/2016 27.2* 36.0 - 46.0 % Final    Physical Exam:  General: alert and cooperative Lochia: appropriate Uterine Fundus: firm Incision: na DVT Evaluation: No evidence of DVT seen on physical exam.  Discharge Diagnoses: Term Pregnancy-delivered by Dr Richardson Doppole.  She received amp and gent for chorioamnionitis. Pt did well asked for Percocet for PP care.  She will have an out patient circ  Discharge Information: Date: 02/15/2016 Activity: pelvic rest Diet: routine Medications: PNV, Ibuprofen and Percocet Condition: stable Instructions: refer to practice specific booklet Discharge to: home Follow-up Information    [facility] In 6 weeks.   Specialty:  Obstetrics and Gynecology   Contact information:   [address] [place] [id number] [phone]      Newborn Data: Live born female  Birth Weight: 7 lb 13.4 oz (3555 g) APGAR: 9, 9  Home with mother.  Kareem Cathey A 02/15/2016, 12:01 PM

## 2016-02-15 NOTE — Lactation Note (Signed)
This note was copied from a baby's chart. Lactation Consultation Note  Patient Name: Karen Janan Ridgember Gatt RUEAV'WToday's Date: 02/15/2016   Visited with Mom on day of discharge, baby 9743 hrs old.  Baby had been giving all bottles due to painful latches.  This morning, her RN assisted with latch, and Mom return demonstrated.  Mom states latch was comfortable, and she heard swallowing.  Encouraged skin to skin, and cue based feedings, recommended >8 feedings per 24 hrs. Engorgement prevention and treatment discussed.  Reminded her of OP lactation services available to her.  Offered to make an OP lactation appointment for her for follow up due to many bottles and history of painful latches.  Mom prefers to call if she feels she needs to be seen.  To call prn.     Judee ClaraSmith, Lygia Olaes E 02/15/2016, 10:56 AM

## 2016-07-24 ENCOUNTER — Encounter (HOSPITAL_COMMUNITY): Payer: Self-pay | Admitting: Emergency Medicine

## 2016-07-24 ENCOUNTER — Emergency Department (HOSPITAL_COMMUNITY)
Admission: EM | Admit: 2016-07-24 | Discharge: 2016-07-24 | Disposition: A | Discharge status: Home / Self Care | Payer: Medicaid Other | Attending: Emergency Medicine | Admitting: Emergency Medicine

## 2016-07-24 DIAGNOSIS — R300 Dysuria: Secondary | ICD-10-CM | POA: Diagnosis present

## 2016-07-24 DIAGNOSIS — Z79899 Other long term (current) drug therapy: Secondary | ICD-10-CM | POA: Insufficient documentation

## 2016-07-24 DIAGNOSIS — N3000 Acute cystitis without hematuria: Secondary | ICD-10-CM | POA: Diagnosis not present

## 2016-07-24 DIAGNOSIS — F17210 Nicotine dependence, cigarettes, uncomplicated: Secondary | ICD-10-CM | POA: Diagnosis not present

## 2016-07-24 LAB — URINALYSIS, ROUTINE W REFLEX MICROSCOPIC
Glucose, UA: NEGATIVE mg/dL
Ketones, ur: NEGATIVE mg/dL
Nitrite: NEGATIVE
PROTEIN: 30 mg/dL — AB
SPECIFIC GRAVITY, URINE: 1.028 (ref 1.005–1.030)
pH: 5.5 (ref 5.0–8.0)

## 2016-07-24 LAB — WET PREP, GENITAL
CLUE CELLS WET PREP: NONE SEEN
Sperm: NONE SEEN
TRICH WET PREP: NONE SEEN
YEAST WET PREP: NONE SEEN

## 2016-07-24 LAB — URINE MICROSCOPIC-ADD ON

## 2016-07-24 LAB — PREGNANCY, URINE: PREG TEST UR: NEGATIVE

## 2016-07-24 MED ORDER — CEPHALEXIN 500 MG PO CAPS: 1000.0000 mg | ORAL_CAPSULE | Freq: Two times a day (BID) | ORAL | 0 refills | Status: DC

## 2016-07-24 NOTE — ED Triage Notes (Signed)
Patient states that on Tuesday last week started having LLQ pain and dysuria with increase in frequency of urination. Patient denies any sexual intercourse since she was treated few months ago for STD.

## 2016-07-24 NOTE — ED Provider Notes (Signed)
[facility] Provider Note   CSN: [id number] Arrival date & time: 07/24/16  1201     History   Chief Complaint Chief Complaint  Patient presents with  . LLQ pain  . Dysuria    HPI Karen Frederick is a 20 y.o. female.  HPI Patient reports she's been having urinary frequency for 5 days. She reports she's getting burning right at the end of urination. No lower back pain or flank pain. Fever, chills, nausea or vomiting. Some suprapubic discomfort. Patient denies sexual activity for 3 months. Past Medical History:  Diagnosis Date  . Trichomonal vaginitis     Patient Active Problem List   Diagnosis Date Noted  . Vaginal delivery 02/14/2016  . Obesity 02/13/2016  . Anemia affecting pregnancy 02/13/2016  . Late prenatal care 02/13/2016    Past Surgical History:  Procedure Laterality Date  . HERNIA REPAIR     umbilical    OB History    Gravida Para Term Preterm AB Living   1 1 1     1    SAB TAB Ectopic Multiple Live Births         0 1       Home Medications    Prior to Admission medications   Medication Sig Start Date End Date Taking? Authorizing Provider  cephALEXin (KEFLEX) 500 MG capsule Take 2 capsules (1,000 mg total) by mouth 2 (two) times daily. 07/24/16   Arby BarretteMarcy Garvey Westcott, MD  ibuprofen (ADVIL,MOTRIN) 600 MG tablet Take 1 tablet (600 mg total) by mouth every 6 (six) hours. Patient not taking: Reported on 07/24/2016 02/15/16   Jaymes GraffNaima Dillard, MD  oxyCODONE (OXY IR/ROXICODONE) 5 MG immediate release tablet Take 1 tablet (5 mg total) by mouth every 4 (four) hours as needed (pain scale 4-7). Patient not taking: Reported on 07/24/2016 02/15/16   Jaymes GraffNaima Dillard, MD  oxyCODONE-acetaminophen (PERCOCET/ROXICET) 5-325 MG tablet Take 1 tablet by mouth every 6 (six) hours as needed for severe pain. Patient not taking: Reported on 07/24/2016 02/12/16   Sherre ScarletKimberly Williams, CNM    Family History Family History  Problem Relation Age of Onset  . Diabetes Paternal Grandmother     . Cancer Paternal Grandmother     breast with mets  . Alcohol abuse Neg Hx   . Arthritis Neg Hx   . Asthma Neg Hx   . Birth defects Neg Hx   . COPD Neg Hx   . Depression Neg Hx   . Drug abuse Neg Hx   . Early death Neg Hx   . Hearing loss Neg Hx   . Heart disease Neg Hx   . Hyperlipidemia Neg Hx   . Hypertension Neg Hx   . Kidney disease Neg Hx   . Learning disabilities Neg Hx   . Mental illness Neg Hx   . Mental retardation Neg Hx   . Miscarriages / Stillbirths Neg Hx   . Stroke Neg Hx   . Vision loss Neg Hx   . Varicose Veins Neg Hx     Social History Social History  Substance Use Topics  . Smoking status: Current Every Day Smoker    Packs/day: 0.25    Types: Cigarettes  . Smokeless tobacco: Never Used     Comment: trying to quit  . Alcohol use No     Allergies   Patient has no known allergies.   Review of Systems Review of Systems 10 Systems reviewed and are negative for acute change except as noted in the HPI.  Physical Exam Updated Vital Signs BP 123/86   Pulse 81   Temp 98.8 F (37.1 C) (Oral)   Resp 16   Ht 5\' 7"  (1.702 m)   Wt 226 lb (102.5 kg)   LMP 07/10/2016   SpO2 100%   BMI 35.40 kg/m   Physical Exam  Constitutional: She appears well-developed and well-nourished. No distress.  HENT:  Head: Normocephalic and atraumatic.  Eyes: Conjunctivae are normal.  Neck: Neck supple.  Cardiovascular: Normal rate and regular rhythm.   No murmur heard. Pulmonary/Chest: Effort normal and breath sounds normal. No respiratory distress.  Abdominal: Soft. There is no tenderness.  Genitourinary:  Genitourinary Comments: External female genitalia normal. Speculum examination no discharge and no bleeding. Cervix is not friable. Uterus and adnexa are nontender.  Musculoskeletal: She exhibits no edema.  Neurological: She is alert.  Skin: Skin is warm and dry.  Psychiatric: She has a normal mood and affect.  Nursing note and vitals reviewed.    ED  Treatments / Results  Labs (all labs ordered are listed, but only abnormal results are displayed) Labs Reviewed  WET PREP, GENITAL - Abnormal; Notable for the following:       Result Value   WBC, Wet Prep HPF POC MANY (*)    All other components within normal limits  URINALYSIS, ROUTINE W REFLEX MICROSCOPIC (NOT AT [facility]) - Abnormal; Notable for the following:    APPearance CLOUDY (*)    Hgb urine dipstick SMALL (*)    Bilirubin Urine SMALL (*)    Protein, ur 30 (*)    Leukocytes, UA LARGE (*)    All other components within normal limits  URINE MICROSCOPIC-ADD ON - Abnormal; Notable for the following:    Squamous Epithelial / LPF 0-5 (*)    Bacteria, UA FEW (*)    All other components within normal limits  URINE CULTURE  PREGNANCY, URINE  GC/CHLAMYDIA PROBE AMP ([place]) NOT AT [facility]    EKG  EKG Interpretation None       Radiology No results found.  Procedures Procedures (including critical care time)  Medications Ordered in ED Medications - No data to display   Initial Impression / Assessment and Plan / ED Course  I have reviewed the triage vital signs and the nursing notes.  Pertinent labs & imaging results that were available during my care of the patient were reviewed by me and considered in my medical decision making (see chart for details).  Clinical Course     Final Clinical Impressions(s) / ED Diagnoses   Final diagnoses:  Acute cystitis without hematuria   Patient presents with symptoms of uncomplicated UTI. Pelvic examination is normal without suspicion for PID or cervicitis at this time. Patient is treated with Keflex. New Prescriptions Discharge Medication List as of 07/24/2016  2:29 PM    START taking these medications   Details  cephALEXin (KEFLEX) 500 MG capsule Take 2 capsules (1,000 mg total) by mouth 2 (two) times daily., Starting Sun 07/24/2016, Print         Arby BarretteMarcy Hasheem Voland, MD 07/24/16 [phone]

## 2016-07-25 LAB — GC/CHLAMYDIA PROBE AMP (~~LOC~~) NOT AT ARMC
Chlamydia: NEGATIVE
Neisseria Gonorrhea: NEGATIVE

## 2016-07-26 LAB — URINE CULTURE

## 2016-07-27 ENCOUNTER — Telehealth (HOSPITAL_BASED_OUTPATIENT_CLINIC_OR_DEPARTMENT_OTHER): Payer: Self-pay | Admitting: Emergency Medicine

## 2016-07-27 NOTE — Telephone Encounter (Signed)
Post ED Visit - Positive Culture Follow-up  Culture report reviewed by antimicrobial stewardship pharmacist:  []  Karen Frederick, Pharm.D. []  Karen Frederick, Pharm.D., BCPS []  Karen Frederick, Pharm.D. []  Karen Frederick, Pharm.D., BCPS []  Karen Frederick, [place] BCPS, AAHIVP []  Karen Frederick, Pharm.D., BCPS, AAHIVP []  Karen Frederick, [address] []  Karen Frederick, [address]  Positive urine culture Treated with cephalexin, organism sensitive to the same and no further patient follow-up is required at this time.  Karen Frederick, Karen Frederick 07/27/2016, 11:17 AM

## 2016-09-03 ENCOUNTER — Encounter (HOSPITAL_COMMUNITY): Payer: Self-pay | Admitting: Family Medicine

## 2016-09-03 ENCOUNTER — Emergency Department (HOSPITAL_COMMUNITY)
Admission: EM | Admit: 2016-09-03 | Discharge: 2016-09-03 | Disposition: A | Discharge status: Home / Self Care | Payer: Medicaid Other | Attending: Emergency Medicine | Admitting: Emergency Medicine

## 2016-09-03 DIAGNOSIS — Z79899 Other long term (current) drug therapy: Secondary | ICD-10-CM | POA: Diagnosis not present

## 2016-09-03 DIAGNOSIS — N939 Abnormal uterine and vaginal bleeding, unspecified: Secondary | ICD-10-CM | POA: Insufficient documentation

## 2016-09-03 DIAGNOSIS — Z5321 Procedure and treatment not carried out due to patient leaving prior to being seen by health care provider: Secondary | ICD-10-CM | POA: Diagnosis not present

## 2016-09-03 DIAGNOSIS — R1030 Lower abdominal pain, unspecified: Secondary | ICD-10-CM | POA: Insufficient documentation

## 2016-09-03 DIAGNOSIS — F17210 Nicotine dependence, cigarettes, uncomplicated: Secondary | ICD-10-CM | POA: Insufficient documentation

## 2016-09-03 NOTE — ED Notes (Signed)
Pt called from lobby to be roomed with not response. RN notified.

## 2016-09-03 NOTE — ED Notes (Signed)
Bed: WTR5 Expected date:  Expected time:  Means of arrival:  Comments: 

## 2016-09-03 NOTE — ED Triage Notes (Signed)
Patient reports after having sexual intercourse yesterday, she developed lower abd pain with vaginal bleeding. Pt states she has went through 8 pads since yesterday at 13:00. While triaging, patient appears sleepy.

## 2016-09-03 NOTE — ED Notes (Signed)
Called to take to room  No response from lobby  

## 2016-09-05 ENCOUNTER — Emergency Department (HOSPITAL_COMMUNITY)
Admission: EM | Admit: 2016-09-05 | Discharge: 2016-09-05 | Discharge status: Against Medical Advice | Payer: Medicaid Other

## 2016-09-05 NOTE — ED Notes (Signed)
First attempted.PT did not answer for triage

## 2016-09-05 NOTE — ED Notes (Signed)
Patient called in main ED waiting room area with no response 

## 2016-09-05 NOTE — ED Notes (Signed)
Patient states she no longer wants to be seen prior to triage.

## 2017-07-12 ENCOUNTER — Ambulatory Visit (HOSPITAL_COMMUNITY)
Admission: EM | Admit: 2017-07-12 | Discharge: 2017-07-12 | Disposition: A | Discharge status: Home / Self Care | Payer: Self-pay | Attending: Family Medicine | Admitting: Family Medicine

## 2017-07-12 ENCOUNTER — Telehealth (HOSPITAL_COMMUNITY): Payer: Self-pay | Admitting: Emergency Medicine

## 2017-07-12 ENCOUNTER — Other Ambulatory Visit: Payer: Self-pay

## 2017-07-12 ENCOUNTER — Encounter (HOSPITAL_COMMUNITY): Payer: Self-pay | Admitting: Emergency Medicine

## 2017-07-12 DIAGNOSIS — Z3202 Encounter for pregnancy test, result negative: Secondary | ICD-10-CM

## 2017-07-12 DIAGNOSIS — R300 Dysuria: Secondary | ICD-10-CM

## 2017-07-12 DIAGNOSIS — N3090 Cystitis, unspecified without hematuria: Secondary | ICD-10-CM

## 2017-07-12 LAB — POCT URINALYSIS DIP (DEVICE)
Bilirubin Urine: NEGATIVE
GLUCOSE, UA: NEGATIVE mg/dL
Ketones, ur: NEGATIVE mg/dL
Nitrite: NEGATIVE
PH: 6 (ref 5.0–8.0)
PROTEIN: NEGATIVE mg/dL
Specific Gravity, Urine: 1.01 (ref 1.005–1.030)
UROBILINOGEN UA: 0.2 mg/dL (ref 0.0–1.0)

## 2017-07-12 LAB — POCT PREGNANCY, URINE: PREG TEST UR: NEGATIVE

## 2017-07-12 MED ORDER — CEPHALEXIN 500 MG PO CAPS: 500.0000 mg | ORAL_CAPSULE | Freq: Two times a day (BID) | ORAL | 0 refills | Status: DC

## 2017-07-12 MED ORDER — CEPHALEXIN 500 MG PO CAPS: 500.0000 mg | ORAL_CAPSULE | Freq: Two times a day (BID) | ORAL | 0 refills | Status: AC

## 2017-07-12 NOTE — ED Triage Notes (Signed)
Pt states "I think I have a UTI, my urine smells like infection." Pt endorses burning, frequency, urgency.

## 2017-07-12 NOTE — Discharge Instructions (Signed)
Your urine was positive for an urinary tract infection. Start keflex as directed. Keep hydrated, your urine should be clear to pale yellow in color. Monitor for any worsening of symptoms, fever, worsening abdominal pain, nausea/vomiting, flank pain, follow up for reevaluation.  ° °

## 2017-07-12 NOTE — ED Provider Notes (Signed)
[facility]    CSN: [id number] Arrival date & time: 07/12/17  1612     History   Chief Complaint Chief Complaint  Patient presents with  . Dysuria    HPI Karen Frederick is a 21 y.o. female.   21 year old female comes in with 1-2 week history of urinary frequency, urgency, dysuria. She has also noticed foul smelling urine. She took AZO with good relief. Denies abdominal pain, nausea, vomiting, Denies vaginal discharge, itching/pain, spotting. LMP 07/06/2017. She is not currently sexually active, states with 1 female partner, but denies sexual activity.       Past Medical History:  Diagnosis Date  . Trichomonal vaginitis     Patient Active Problem List   Diagnosis Date Noted  . Vaginal delivery 02/14/2016  . Obesity 02/13/2016  . Anemia affecting pregnancy 02/13/2016  . Late prenatal care 02/13/2016    Past Surgical History:  Procedure Laterality Date  . HERNIA REPAIR     umbilical    OB History    Gravida Para Term Preterm AB Living   1 1 1     1    SAB TAB Ectopic Multiple Live Births         0 1       Home Medications    Prior to Admission medications   Medication Sig Start Date End Date Taking? Authorizing Provider  cephALEXin (KEFLEX) 500 MG capsule Take 1 capsule (500 mg total) by mouth 2 (two) times daily for 7 days. 07/12/17 07/19/17  Belinda FisherYu, Faiz Weber V, PA-C    Family History Family History  Problem Relation Age of Onset  . Diabetes Paternal Grandmother   . Cancer Paternal Grandmother        breast with mets  . Alcohol abuse Neg Hx   . Arthritis Neg Hx   . Asthma Neg Hx   . Birth defects Neg Hx   . COPD Neg Hx   . Depression Neg Hx   . Drug abuse Neg Hx   . Early death Neg Hx   . Hearing loss Neg Hx   . Heart disease Neg Hx   . Hyperlipidemia Neg Hx   . Hypertension Neg Hx   . Kidney disease Neg Hx   . Learning disabilities Neg Hx   . Mental illness Neg Hx   . Mental retardation Neg Hx   . Miscarriages / Stillbirths Neg Hx   .  Stroke Neg Hx   . Vision loss Neg Hx   . Varicose Veins Neg Hx     Social History Social History   Tobacco Use  . Smoking status: Current Every Day Smoker    Packs/day: 0.25    Types: Cigarettes  . Smokeless tobacco: Never Used  . Tobacco comment: trying to quit  Substance Use Topics  . Alcohol use: No  . Drug use: Yes    Types: Marijuana     Allergies   Patient has no known allergies.   Review of Systems Review of Systems  Reason unable to perform ROS: See HPI as above.     Physical Exam Triage Vital Signs ED Triage Vitals [07/12/17 1629]  Enc Vitals Group     BP 129/65     Pulse Rate (!) 103     Resp 18     Temp 98.5 F (36.9 C)     Temp src      SpO2 99 %     Weight      Height  Head Circumference      Peak Flow      Pain Score      Pain Loc      Pain Edu?      Excl. in GC?    No data found.  Updated Vital Signs BP 129/65   Pulse (!) 103   Temp 98.5 F (36.9 C)   Resp 18   LMP 07/06/2017   SpO2 99%   Physical Exam  Constitutional: She is oriented to person, place, and time. She appears well-developed and well-nourished. No distress.  Eyes: Conjunctivae are normal. Pupils are equal, round, and reactive to light.  Cardiovascular: Normal rate, regular rhythm and normal heart sounds. Exam reveals no gallop and no friction rub.  No murmur heard. Pulmonary/Chest: Effort normal and breath sounds normal. She has no wheezes. She has no rales.  Abdominal: Soft. Bowel sounds are normal. She exhibits no distension. There is no tenderness. There is no rebound, no guarding and no CVA tenderness.  Neurological: She is alert and oriented to person, place, and time.  Skin: Skin is warm and dry.     UC Treatments / Results  Labs (all labs ordered are listed, but only abnormal results are displayed) Labs Reviewed  POCT URINALYSIS DIP (DEVICE) - Abnormal; Notable for the following components:      Result Value   Hgb urine dipstick TRACE (*)     Leukocytes, UA SMALL (*)    All other components within normal limits  URINE CULTURE  POCT PREGNANCY, URINE    EKG  EKG Interpretation None       Radiology No results found.  Procedures Procedures (including critical care time)  Medications Ordered in UC Medications - No data to display   Initial Impression / Assessment and Plan / UC Course  I have reviewed the triage vital signs and the nursing notes.  Pertinent labs & imaging results that were available during my care of the patient were reviewed by me and considered in my medical decision making (see chart for details).     Urine dipstick positive for UTI. Start antibiotics as directed. Push fluids. Return precautions given.  Final Clinical Impressions(s) / UC Diagnoses   Final diagnoses:  Cystitis    ED Discharge Orders        Ordered    cephALEXin (KEFLEX) 500 MG capsule  2 times daily     07/12/17 1709         Belinda FisherYu, Marnell Mcdaniel V, PA-C 07/12/17 1713

## 2018-03-29 ENCOUNTER — Inpatient Hospital Stay (HOSPITAL_COMMUNITY): Payer: Medicaid Other

## 2018-03-29 ENCOUNTER — Encounter (HOSPITAL_COMMUNITY): Payer: Self-pay | Admitting: *Deleted

## 2018-03-29 ENCOUNTER — Inpatient Hospital Stay (HOSPITAL_COMMUNITY)
Admission: AD | Admit: 2018-03-29 | Discharge: 2018-03-29 | Disposition: A | Discharge status: Home / Self Care | Payer: Medicaid Other | Source: Ambulatory Visit | Attending: Obstetrics & Gynecology | Admitting: Obstetrics & Gynecology

## 2018-03-29 DIAGNOSIS — O219 Vomiting of pregnancy, unspecified: Secondary | ICD-10-CM | POA: Insufficient documentation

## 2018-03-29 DIAGNOSIS — K5901 Slow transit constipation: Secondary | ICD-10-CM | POA: Insufficient documentation

## 2018-03-29 DIAGNOSIS — O4691 Antepartum hemorrhage, unspecified, first trimester: Secondary | ICD-10-CM | POA: Insufficient documentation

## 2018-03-29 DIAGNOSIS — R109 Unspecified abdominal pain: Secondary | ICD-10-CM | POA: Insufficient documentation

## 2018-03-29 DIAGNOSIS — O99331 Smoking (tobacco) complicating pregnancy, first trimester: Secondary | ICD-10-CM | POA: Insufficient documentation

## 2018-03-29 DIAGNOSIS — Z3A01 Less than 8 weeks gestation of pregnancy: Secondary | ICD-10-CM | POA: Diagnosis not present

## 2018-03-29 DIAGNOSIS — Z6790 Unspecified blood type, Rh positive: Secondary | ICD-10-CM | POA: Insufficient documentation

## 2018-03-29 DIAGNOSIS — O418X10 Other specified disorders of amniotic fluid and membranes, first trimester, not applicable or unspecified: Secondary | ICD-10-CM

## 2018-03-29 DIAGNOSIS — O468X1 Other antepartum hemorrhage, first trimester: Secondary | ICD-10-CM

## 2018-03-29 DIAGNOSIS — O26899 Other specified pregnancy related conditions, unspecified trimester: Secondary | ICD-10-CM

## 2018-03-29 DIAGNOSIS — F17210 Nicotine dependence, cigarettes, uncomplicated: Secondary | ICD-10-CM | POA: Insufficient documentation

## 2018-03-29 DIAGNOSIS — O26891 Other specified pregnancy related conditions, first trimester: Secondary | ICD-10-CM | POA: Diagnosis not present

## 2018-03-29 HISTORY — DX: Gonococcal infection, unspecified: A54.9

## 2018-03-29 LAB — CBC
HEMATOCRIT: 33.7 % — AB (ref 36.0–46.0)
Hemoglobin: 11.2 g/dL — ABNORMAL LOW (ref 12.0–15.0)
MCH: 27.9 pg (ref 26.0–34.0)
MCHC: 33.2 g/dL (ref 30.0–36.0)
MCV: 84 fL (ref 78.0–100.0)
PLATELETS: 208 10*3/uL (ref 150–400)
RBC: 4.01 MIL/uL (ref 3.87–5.11)
RDW: 15.6 % — AB (ref 11.5–15.5)
WBC: 7.3 10*3/uL (ref 4.0–10.5)

## 2018-03-29 LAB — WET PREP, GENITAL
Clue Cells Wet Prep HPF POC: NONE SEEN
SPERM: NONE SEEN
TRICH WET PREP: NONE SEEN
Yeast Wet Prep HPF POC: NONE SEEN

## 2018-03-29 LAB — URINALYSIS, ROUTINE W REFLEX MICROSCOPIC
BILIRUBIN URINE: NEGATIVE
GLUCOSE, UA: NEGATIVE mg/dL
HGB URINE DIPSTICK: NEGATIVE
Ketones, ur: NEGATIVE mg/dL
Leukocytes, UA: NEGATIVE
Nitrite: NEGATIVE
PROTEIN: NEGATIVE mg/dL
Specific Gravity, Urine: 1.024 (ref 1.005–1.030)
pH: 6 (ref 5.0–8.0)

## 2018-03-29 LAB — POCT PREGNANCY, URINE: PREG TEST UR: POSITIVE — AB

## 2018-03-29 LAB — HCG, QUANTITATIVE, PREGNANCY: hCG, Beta Chain, Quant, S: 47036 m[IU]/mL — ABNORMAL HIGH (ref <5)

## 2018-03-29 MED ORDER — POLYETHYLENE GLYCOL 3350 17 GM/SCOOP PO POWD: 17.0000 g | Freq: Every day | ORAL | 0 refills | Status: DC

## 2018-03-29 NOTE — MAU Note (Signed)
Pt reports her period was a little late this month but then stared having vaginal on 03/10/2018 but it starts and then stops. And starts again. Also c/o pai in he URQ and pain and burning in her stomach and chest. Took 4 pregnancy teats and 2 where positive and 2 were negative.

## 2018-03-29 NOTE — MAU Provider Note (Signed)
History     CSN: [id number]  Arrival date and time: 03/29/18 9147   First Provider Initiated Contact with Patient 03/29/18 [phone]      Chief Complaint  Patient presents with  . Abdominal Pain  . Vaginal Bleeding   G2P1001 at unknown gestation here with VB and abd pain. Reports intermittent VB over the last 2 weeks. Bleeding is light and requires panty liner at times. Pain started in lower abdomen and pelvis then radiates into upper abdomen and chest. Denies urinary sx. Admits to constipation, not sure when last BM was. Reports occasional N/V but has improved over the last week. She can tolerate po. No fevers.    OB History    Gravida  2   Para  1   Term  1   Preterm      AB      Living  1     SAB      TAB      Ectopic      Multiple  0   Live Births  1           Past Medical History:  Diagnosis Date  . Gonorrhea   . Trichomonal vaginitis     Past Surgical History:  Procedure Laterality Date  . HERNIA REPAIR     umbilical    Family History  Problem Relation Age of Onset  . Diabetes Paternal Grandmother   . Cancer Paternal Grandmother        breast with mets  . Alcohol abuse Neg Hx   . Arthritis Neg Hx   . Asthma Neg Hx   . Birth defects Neg Hx   . COPD Neg Hx   . Depression Neg Hx   . Drug abuse Neg Hx   . Early death Neg Hx   . Hearing loss Neg Hx   . Heart disease Neg Hx   . Hyperlipidemia Neg Hx   . Hypertension Neg Hx   . Kidney disease Neg Hx   . Learning disabilities Neg Hx   . Mental illness Neg Hx   . Mental retardation Neg Hx   . Miscarriages / Stillbirths Neg Hx   . Stroke Neg Hx   . Vision loss Neg Hx   . Varicose Veins Neg Hx     Social History   Tobacco Use  . Smoking status: Current Every Day Smoker    Packs/day: 0.25    Types: Cigarettes  . Smokeless tobacco: Never Used  . Tobacco comment: trying to quit  Substance Use Topics  . Alcohol use: No  . Drug use: Not Currently    Types: Marijuana    Allergies: No  Known Allergies  No medications prior to admission.    Review of Systems  Constitutional: Negative for chills and fever.  Gastrointestinal: Positive for abdominal pain, constipation, nausea and vomiting.  Genitourinary: Positive for vaginal bleeding. Negative for dysuria, hematuria, urgency and vaginal discharge.   Physical Exam   Blood pressure (!) 141/61, pulse 66, temperature 98.6 F (37 C), resp. rate 18, height 5\' 7"  (1.702 m), weight 112 kg, last menstrual period 03/10/2018, unknown if currently breastfeeding.  Physical Exam  Constitutional: She is oriented to person, place, and time. She appears well-developed and well-nourished. No distress.  HENT:  Head: Normocephalic and atraumatic.  Neck: Normal range of motion.  Cardiovascular: Normal rate.  Respiratory: Effort normal. No respiratory distress.  GI: Soft. She exhibits no distension and no mass. There is no tenderness. There is  no rebound and no guarding.  Genitourinary:  Genitourinary Comments: External: no lesions or erythema Vagina: rugated, pink, moist, scant thin white discharge Uterus: non enlarged, anteverted, non tender, no CMT Adnexae: no masses, no tenderness left, no tenderness right Cervix closed/long   Musculoskeletal: Normal range of motion.  Neurological: She is alert and oriented to person, place, and time.  Skin: Skin is warm and dry.  Psychiatric: She has a normal mood and affect.   Results for orders placed or performed during the hospital encounter of 03/29/18 (from the past 24 hour(s))  Urinalysis, Routine w reflex microscopic     Status: None   Collection Time: 03/29/18  9:16 AM  Result Value Ref Range   Color, Urine YELLOW YELLOW   APPearance CLEAR CLEAR   Specific Gravity, Urine 1.024 1.005 - 1.030   pH 6.0 5.0 - 8.0   Glucose, UA NEGATIVE NEGATIVE mg/dL   Hgb urine dipstick NEGATIVE NEGATIVE   Bilirubin Urine NEGATIVE NEGATIVE   Ketones, ur NEGATIVE NEGATIVE mg/dL   Protein, ur  NEGATIVE NEGATIVE mg/dL   Nitrite NEGATIVE NEGATIVE   Leukocytes, UA NEGATIVE NEGATIVE  Pregnancy, urine POC     Status: Abnormal   Collection Time: 03/29/18  9:20 AM  Result Value Ref Range   Preg Test, Ur POSITIVE (A) NEGATIVE  Wet prep, genital     Status: Abnormal   Collection Time: 03/29/18  9:47 AM  Result Value Ref Range   Yeast Wet Prep HPF POC NONE SEEN NONE SEEN   Trich, Wet Prep NONE SEEN NONE SEEN   Clue Cells Wet Prep HPF POC NONE SEEN NONE SEEN   WBC, Wet Prep HPF POC MODERATE (A) NONE SEEN   Sperm NONE SEEN   hCG, quantitative, pregnancy     Status: Abnormal   Collection Time: 03/29/18  9:55 AM  Result Value Ref Range   hCG, Beta Chain, Quant, S 47,036 (H) <5 mIU/mL  CBC     Status: Abnormal   Collection Time: 03/29/18  9:55 AM  Result Value Ref Range   WBC 7.3 4.0 - 10.5 K/uL   RBC 4.01 3.87 - 5.11 MIL/uL   Hemoglobin 11.2 (L) 12.0 - 15.0 g/dL   HCT 16.1 (L) 09.6 - 04.5 %   MCV 84.0 78.0 - 100.0 fL   MCH 27.9 26.0 - 34.0 pg   MCHC 33.2 30.0 - 36.0 g/dL   RDW 40.9 (H) 81.1 - 91.4 %   Platelets 208 150 - 400 K/uL   US Ob Less Than 14 Weeks With Ob Transvaginal  Result Date: 03/29/2018 CLINICAL DATA:  Abdominal pain in  pregnancy.  Unsure of LMP. EXAM: OBSTETRIC <14 WK [place] AND TRANSVAGINAL OB US TECHNIQUE: Both transabdominal and transvaginal ultrasound examinations were performed for complete evaluation of the gestation as well as the maternal uterus, adnexal regions, and pelvic cul-de-sac. Transvaginal technique was performed to assess early pregnancy. COMPARISON:  None. FINDINGS: Intrauterine gestational sac: Single Yolk sac:  Visualized. Embryo:  Visualized. Cardiac Activity: Visualized. Heart Rate: 139 bpm CRL:  9 mm   6 w   6 d                  [place] EDC: 11/16/2018 Subchorionic hemorrhage:  None visualized. Maternal uterus/adnexae: Small left ovarian corpus luteum noted. Otherwise unremarkable appearance of both ovaries. No adnexal mass or abnormal free fluid  identified. IMPRESSION: Single living IUP measuring 6 weeks 6 days, with [place] EDC of 11/16/2018. No significant maternal uterine or adnexal abnormality  identified. Electronically Signed   By: Myles RosenthalJohn  Stahl M.D.   On: 03/29/2018 10:36   MAU Course  Procedures   MDM Labs and US ordered and reviewed. Viable IUP on US with 2 small SCH (prelim report), likely cause of bleeding. Pain likely constipation, discussed treatment measures, will Rx Miralax. Stable for discharge home.   Assessment and Plan   1. [redacted] weeks gestation of pregnancy   2. Abdominal pain in pregnancy   3. Subchorionic hematoma in first trimester, single or unspecified fetus   4. Blood type, Rh positive   5. Slow transit constipation    Discharge home Follow up at [facility] to start care in 3 weeks SAB/bleeding precautions Rx Miralax  Allergies as of 03/29/2018   No Known Allergies     Medication List    TAKE these medications   polyethylene glycol powder powder Commonly known as:  GLYCOLAX/MIRALAX Take 17 g by mouth daily.      Donette LarryMelanie Esdras Delair, CNM 03/29/2018, 11:04 AM

## 2018-03-29 NOTE — Discharge Instructions (Signed)
Subchorionic Hematoma A subchorionic hematoma is a gathering of blood between the outer wall of the placenta and the inner wall of the womb (uterus). The placenta is the organ that connects the fetus to the wall of the uterus. The placenta performs the feeding, breathing (oxygen to the fetus), and waste removal (excretory work) of the fetus. Subchorionic hematoma is the most common abnormality found on a result from ultrasonography done during the first trimester or early second trimester of pregnancy. If there has been little or no vaginal bleeding, early small hematomas usually shrink on their own and do not affect your baby or pregnancy. The blood is gradually absorbed over 1-2 weeks. When bleeding starts later in pregnancy or the hematoma is larger or occurs in an older pregnant woman, the outcome may not be as good. Larger hematomas may get bigger, which increases the chances for miscarriage. Subchorionic hematoma also increases the risk of premature detachment of the placenta from the uterus, preterm (premature) labor, and stillbirth. Follow these instructions at home:  Stay on bed rest if your health care provider recommends this. Although bed rest will not prevent more bleeding or prevent a miscarriage, your health care provider may recommend bed rest until you are advised otherwise.  Avoid heavy lifting (more than 10 lb [4.5 kg]), exercise, sexual intercourse, or douching as directed by your health care provider.  Keep track of the number of pads you use each day and how soaked (saturated) they are. Write down this information.  Do not use tampons.  Keep all follow-up appointments as directed by your health care provider. Your health care provider may ask you to have follow-up blood tests or ultrasound tests or both. Get help right away if:  You have severe cramps in your stomach, back, abdomen, or pelvis.  You have a fever.  You pass large clots or tissue. Save any tissue for your  health care provider to look at.  Your bleeding increases or you become lightheaded, feel weak, or have fainting episodes. This information is not intended to replace advice given to you by your health care provider. Make sure you discuss any questions you have with your health care provider. Document Released: 11/23/2006 Document Revised: 01/14/2016 Document Reviewed: 03/07/2013 Elsevier Interactive Patient Education  2017 [employer]   [place] [facility] Providers     [facility]     Phone: [phone]  [facility] [place]  Phone: [phone]  [facility] [place]  Phone: [phone]  [facility]                           Phone: [phone]  [facility] for [facility]          Phone: [phone]  [facility]    Phone: [phone]   [facility]    Phone: [phone]  [facility]    Phone: [phone]  [facility]    Phone: [phone]  [facility]    Phone: [phone]  [facility]  Phone: [phone]  [facility]               Phone: [phone]  [facility] [place]   Phone: [phone]  [facility]    Phone: [phone]

## 2018-03-30 LAB — GC/CHLAMYDIA PROBE AMP (~~LOC~~) NOT AT ARMC
CHLAMYDIA, DNA PROBE: NEGATIVE
NEISSERIA GONORRHEA: NEGATIVE

## 2018-04-04 ENCOUNTER — Inpatient Hospital Stay (HOSPITAL_COMMUNITY)
Admission: AD | Admit: 2018-04-04 | Discharge: 2018-04-05 | Disposition: A | Discharge status: Home / Self Care | Payer: Medicaid Other | Attending: Obstetrics & Gynecology | Admitting: Obstetrics & Gynecology

## 2018-04-04 ENCOUNTER — Inpatient Hospital Stay (HOSPITAL_COMMUNITY): Payer: Medicaid Other

## 2018-04-04 ENCOUNTER — Encounter (HOSPITAL_COMMUNITY): Payer: Self-pay | Admitting: *Deleted

## 2018-04-04 ENCOUNTER — Other Ambulatory Visit: Payer: Self-pay

## 2018-04-04 DIAGNOSIS — O99331 Smoking (tobacco) complicating pregnancy, first trimester: Secondary | ICD-10-CM | POA: Diagnosis not present

## 2018-04-04 DIAGNOSIS — N920 Excessive and frequent menstruation with regular cycle: Secondary | ICD-10-CM

## 2018-04-04 DIAGNOSIS — F17210 Nicotine dependence, cigarettes, uncomplicated: Secondary | ICD-10-CM | POA: Insufficient documentation

## 2018-04-04 DIAGNOSIS — O039 Complete or unspecified spontaneous abortion without complication: Secondary | ICD-10-CM | POA: Insufficient documentation

## 2018-04-04 DIAGNOSIS — R1030 Lower abdominal pain, unspecified: Secondary | ICD-10-CM

## 2018-04-04 DIAGNOSIS — Z3A01 Less than 8 weeks gestation of pregnancy: Secondary | ICD-10-CM | POA: Diagnosis not present

## 2018-04-04 DIAGNOSIS — O209 Hemorrhage in early pregnancy, unspecified: Secondary | ICD-10-CM | POA: Diagnosis present

## 2018-04-04 DIAGNOSIS — N939 Abnormal uterine and vaginal bleeding, unspecified: Secondary | ICD-10-CM

## 2018-04-04 DIAGNOSIS — Z332 Encounter for elective termination of pregnancy: Secondary | ICD-10-CM

## 2018-04-04 LAB — CBC
HCT: 32.9 % — ABNORMAL LOW (ref 36.0–46.0)
Hemoglobin: 10.9 g/dL — ABNORMAL LOW (ref 12.0–15.0)
MCH: 27.8 pg (ref 26.0–34.0)
MCHC: 33.1 g/dL (ref 30.0–36.0)
MCV: 83.9 fL (ref 78.0–100.0)
Platelets: 224 10*3/uL (ref 150–400)
RBC: 3.92 MIL/uL (ref 3.87–5.11)
RDW: 15.6 % — ABNORMAL HIGH (ref 11.5–15.5)
WBC: 10.6 10*3/uL — ABNORMAL HIGH (ref 4.0–10.5)

## 2018-04-04 NOTE — MAU Note (Signed)
Pt reports vaginal bleed s/p TAB. Last dose of pills today at 1200. Bleeding started around 1:00 and then soaking pada ever since that time. Pt reports that soaked 4 pads in an hour. Pt reports golf ball sized clots. Pt also sates that she had a fever 102 at 6pm. Pt took naproxen at 1600 for pain. Denies pain at this time.

## 2018-04-05 NOTE — MAU Provider Note (Signed)
Chief Complaint: Fever and Vaginal Bleeding   First Provider Initiated Contact with Patient 04/04/18 2308     SUBJECTIVE HPI: Karen Frederick is a 22 y.o. G2P1001 at 3925w6d by LMP who presents to maternity admissions reporting vaginal bleeding. She reports going to [facility] [place] yesterday to have an TAB, was given Cytotec for abortion. Took medication yesterday and completed course today around noon. She reports around 1300 having heavy vaginal bleeding and having to change multiple pads in an hour. She reports seeing large blood clots with tissue on pads. She states in the past hour having to change 4 pads that were completely saturated. She reports abdominal pain is associated with vaginal bleeding- was given Naproxen by [facility]. She reports taking Naproxen prior to arrival to MAU and pain was relieved with medication. She reports a fever of 102 at 6pm tonight- denies taking any medication to decrease temperature. She denies symptoms of increased blood loss such as lightheadedness, dizziness, n/v.    Past Medical History:  Diagnosis Date  . Gonorrhea   . Trichomonal vaginitis    Past Surgical History:  Procedure Laterality Date  . HERNIA REPAIR     umbilical   Social History   Socioeconomic History  . Marital status: Single    Spouse name: Not on file  . Number of children: Not on file  . Years of education: Not on file  . Highest education level: Not on file  Occupational History  . Not on file  Social Needs  . Financial resource strain: Not hard at all  . Food insecurity:    Worry: Patient refused    Inability: Patient refused  . Transportation needs:    Medical: Patient refused    Non-medical: Patient refused  Tobacco Use  . Smoking status: Current Every Day Smoker    Packs/day: 0.25    Types: Cigarettes  . Smokeless tobacco: Never Used  . Tobacco comment: trying to quit  Substance and Sexual Activity  . Alcohol use: No  . Drug use: Not Currently     Types: Marijuana  . Sexual activity: Yes  Lifestyle  . Physical activity:    Days per week: 0 days    Minutes per session: 0 min  . Stress: Rather much  Relationships  . Social connections:    Talks on phone: More than three times a week    Gets together: Once a week    Attends religious service: Never    Active member of club or organization: No    Attends meetings of clubs or organizations: Never    Relationship status: Never married  . Intimate partner violence:    Fear of current or ex partner: No    Emotionally abused: No    Physically abused: No    Forced sexual activity: No  Other Topics Concern  . Not on file  Social History Narrative  . Not on file   No current facility-administered medications on file prior to encounter.    Current Outpatient Medications on File Prior to Encounter  Medication Sig Dispense Refill  . naproxen (NAPROSYN) 500 MG tablet Take 500 mg by mouth 3 (three) times daily with meals.    . polyethylene glycol powder (GLYCOLAX/MIRALAX) powder Take 17 g by mouth daily. 255 g 0   No Known Allergies  ROS:  Review of Systems  Constitutional: Positive for fever. Negative for chills.  Respiratory: Negative.   Cardiovascular: Negative.   Gastrointestinal: Positive for abdominal pain. Negative for  constipation, diarrhea, nausea and vomiting.  Genitourinary: Positive for vaginal bleeding. Negative for difficulty urinating, dysuria, frequency, pelvic pain, urgency and vaginal pain.  Neurological: Negative.    I have reviewed patient's Past Medical Hx, Surgical Hx, Family Hx, Social Hx, medications and allergies.   Physical Exam   Patient Vitals for the past 24 hrs:  BP Temp Temp src Pulse Resp Height Weight  04/05/18 0030 120/64 - - - - - -  04/04/18 2208 134/73 98.4 F (36.9 C) Oral 84 16 5\' 7"  (1.702 m) 109.3 kg   Constitutional: Well-developed, well-nourished female in no acute distress. No fever on arrival to MAU Cardiovascular: normal  rate Respiratory: normal effort GI: Abd soft, non-tender. Pos BS x 4 MS: Extremities nontender, no edema, normal ROM Neurologic: Alert and oriented x 4.   PELVIC EXAM: Cervix pink, visually open, without lesion, small amount of dark red vaginal bleeding noted from the cervical os and surrounding cervix (4 faux swabs used to remove bleeding- no new bleeding on pad after removal of old blood), vaginal walls and external genitalia normal  LAB RESULTS Results for orders placed or performed during the hospital encounter of 04/04/18 (from the past 24 hour(s))  CBC     Status: Abnormal   Collection Time: 04/04/18 11:17 PM  Result Value Ref Range   WBC 10.6 (H) 4.0 - 10.5 K/uL   RBC 3.92 3.87 - 5.11 MIL/uL   Hemoglobin 10.9 (L) 12.0 - 15.0 g/dL   HCT 54.0 (L) 98.1 - 19.1 %   MCV 83.9 78.0 - 100.0 fL   MCH 27.8 26.0 - 34.0 pg   MCHC 33.1 30.0 - 36.0 g/dL   RDW 47.8 (H) 29.5 - 62.1 %   Platelets 224 150 - 400 K/uL    IMAGING US Ob Transvaginal  Result Date: 04/05/2018 CLINICAL DATA:  Vaginal bleeding EXAM: TRANSVAGINAL OB ULTRASOUND TECHNIQUE: Transvaginal ultrasound was performed for complete evaluation of the gestation as well as the maternal uterus, adnexal regions, and pelvic cul-de-sac. COMPARISON:  03/29/2018 FINDINGS: Intrauterine gestational sac: Absent. Previously seen gestational sac no longer identified. Thickened endometrium is again seen consistent with the recent gestation. Subchorionic hemorrhage:  None visualized. Maternal uterus/adnexae: Ovaries are well visualized bilaterally. The right ovary is within normal limits. Small corpus luteum cyst is noted on the left. This is stable from the prior exam. No free fluid is noted. IMPRESSION: Previously seen gestational sac is no longer identified consistent with recent abortion. Some persistent endometrial thickening is noted. Electronically Signed   By: Alcide Clever M.D.   On: 04/05/2018 00:02   US Ob Less Than 14 Weeks With Ob  Transvaginal  Result Date: 03/29/2018 CLINICAL DATA:  Abdominal pain in  pregnancy.  Unsure of LMP. EXAM: OBSTETRIC <14 WK [place] AND TRANSVAGINAL OB US TECHNIQUE: Both transabdominal and transvaginal ultrasound examinations were performed for complete evaluation of the gestation as well as the maternal uterus, adnexal regions, and pelvic cul-de-sac. Transvaginal technique was performed to assess early pregnancy. COMPARISON:  None. FINDINGS: Intrauterine gestational sac: Single Yolk sac:  Visualized. Embryo:  Visualized. Cardiac Activity: Visualized. Heart Rate: 139 bpm CRL:  9 mm   6 w   6 d                  [place] EDC: 11/16/2018 Subchorionic hemorrhage:  None visualized. Maternal uterus/adnexae: Small left ovarian corpus luteum noted. Otherwise unremarkable appearance of both ovaries. No adnexal mass or abnormal free fluid identified. IMPRESSION: Single living IUP measuring 6  weeks 6 days, with US EDC of 11/16/2018. No significant maternal uterine or adnexal abnormality identified. Electronically Signed   By: Myles RosenthalJohn  Stahl M.D.   On: 03/29/2018 10:36    MAU Management/MDM: Orders Placed This Encounter  Procedures  . US OB Transvaginal  . CBC   CBC- WNL, hgb 10.9 from 11.2 on 8/8 US results reviewed with patient, US noted no IUP and complete TAB with medication   Patient's bleeding is decreased when assessed on pad, noted to be 3cm dark red circle on pad prior to discharge home. Patient reports feeling better and continues to not be in any pain.   Patient has a f/u appt scheduled with [facility] on 8/24. Follow up as scheduled. Discussed reasons to return to MAU. Continue Naproxen medication for abdominal pain. Pt discharged. Pt stable at time of discharge.   ASSESSMENT 1. Complete abortion   2. Excessive vaginal bleeding   3. Abortion in first trimester   4. Lower abdominal pain     PLAN Discharge home Follow up as scheduled with [facility]  Return to MAU as needed for reasons  discussed Continue Naproxen medication   Follow-up Information    [facility] Go on 04/14/2018.           Allergies as of 04/05/2018   No Known Allergies     Medication List    TAKE these medications   naproxen 500 MG tablet Commonly known as:  NAPROSYN Take 500 mg by mouth 3 (three) times daily with meals.   polyethylene glycol powder powder Commonly known as:  GLYCOLAX/MIRALAX Take 17 g by mouth daily.      Steward DroneVeronica Arzella Rehmann  Certified Nurse-Midwife 04/05/2018  12:24 AM

## 2018-08-07 ENCOUNTER — Encounter (HOSPITAL_COMMUNITY): Payer: Self-pay | Admitting: *Deleted

## 2018-08-07 ENCOUNTER — Inpatient Hospital Stay (HOSPITAL_COMMUNITY)
Admission: AD | Admit: 2018-08-07 | Discharge: 2018-08-07 | Disposition: A | Discharge status: Home / Self Care | Payer: Medicaid Other | Attending: Obstetrics and Gynecology | Admitting: Obstetrics and Gynecology

## 2018-08-07 DIAGNOSIS — Z3A01 Less than 8 weeks gestation of pregnancy: Secondary | ICD-10-CM | POA: Insufficient documentation

## 2018-08-07 DIAGNOSIS — F17210 Nicotine dependence, cigarettes, uncomplicated: Secondary | ICD-10-CM | POA: Insufficient documentation

## 2018-08-07 DIAGNOSIS — N898 Other specified noninflammatory disorders of vagina: Secondary | ICD-10-CM | POA: Diagnosis present

## 2018-08-07 DIAGNOSIS — O99331 Smoking (tobacco) complicating pregnancy, first trimester: Secondary | ICD-10-CM | POA: Insufficient documentation

## 2018-08-07 DIAGNOSIS — O26891 Other specified pregnancy related conditions, first trimester: Secondary | ICD-10-CM

## 2018-08-07 DIAGNOSIS — O23591 Infection of other part of genital tract in pregnancy, first trimester: Secondary | ICD-10-CM | POA: Insufficient documentation

## 2018-08-07 DIAGNOSIS — Z3201 Encounter for pregnancy test, result positive: Secondary | ICD-10-CM

## 2018-08-07 DIAGNOSIS — B9689 Other specified bacterial agents as the cause of diseases classified elsewhere: Secondary | ICD-10-CM | POA: Insufficient documentation

## 2018-08-07 DIAGNOSIS — N760 Acute vaginitis: Secondary | ICD-10-CM | POA: Diagnosis not present

## 2018-08-07 HISTORY — DX: Anemia, unspecified: D64.9

## 2018-08-07 LAB — URINALYSIS, ROUTINE W REFLEX MICROSCOPIC
Bilirubin Urine: NEGATIVE
GLUCOSE, UA: NEGATIVE mg/dL
HGB URINE DIPSTICK: NEGATIVE
Ketones, ur: NEGATIVE mg/dL
Nitrite: NEGATIVE
PH: 5 (ref 5.0–8.0)
Protein, ur: NEGATIVE mg/dL
Specific Gravity, Urine: 1.025 (ref 1.005–1.030)

## 2018-08-07 LAB — WET PREP, GENITAL
SPERM: NONE SEEN
Trich, Wet Prep: NONE SEEN
Yeast Wet Prep HPF POC: NONE SEEN

## 2018-08-07 LAB — POCT PREGNANCY, URINE: PREG TEST UR: POSITIVE — AB

## 2018-08-07 MED ORDER — METRONIDAZOLE 500 MG PO TABS: 500.0000 mg | ORAL_TABLET | Freq: Two times a day (BID) | ORAL | 0 refills | Status: DC

## 2018-08-07 MED ORDER — PREPLUS 27-1 MG PO TABS: 1.0000 | ORAL_TABLET | Freq: Every day | ORAL | 13 refills | Status: DC

## 2018-08-07 NOTE — MAU Note (Signed)
Pt reports her period is 2 days late and she is having lower abd cramping and vaginal discharge

## 2018-08-07 NOTE — Discharge Instructions (Signed)

## 2018-08-07 NOTE — MAU Provider Note (Signed)
History     CSN: [id number]  Arrival date and time: 08/07/18 1323   First Provider Initiated Contact with Patient 08/07/18 1412      Chief Complaint  Patient presents with  . Possible Pregnancy  . Abdominal Pain  . Vaginal Discharge   HPI Karen Frederick is a 22 y.o. G3P1011 at 1060w5d by LMP who presents to MAU for evaluation of vaginal discharge. This is a new problem, onset one week ago. Patient denies abdominal pain, vaginal bleeding, pain with urination, fever or recent illness.    Patient denies concern for STIs. Female partner engages in penetrative intercourse but patient is confident she is a virgin. Unsure of partner testing status.  OB History    Gravida  3   Para  1   Term  1   Preterm      AB  1   Living  1     SAB  0   TAB  1   Ectopic      Multiple  0   Live Births  1           Past Medical History:  Diagnosis Date  . Anemia   . Gonorrhea   . Trichomonal vaginitis     Past Surgical History:  Procedure Laterality Date  . HERNIA REPAIR     umbilical    Family History  Problem Relation Age of Onset  . Diabetes Paternal Grandmother   . Cancer Paternal Grandmother        breast with mets  . Alcohol abuse Neg Hx   . Arthritis Neg Hx   . Asthma Neg Hx   . Birth defects Neg Hx   . COPD Neg Hx   . Depression Neg Hx   . Drug abuse Neg Hx   . Early death Neg Hx   . Hearing loss Neg Hx   . Heart disease Neg Hx   . Hyperlipidemia Neg Hx   . Hypertension Neg Hx   . Kidney disease Neg Hx   . Learning disabilities Neg Hx   . Mental illness Neg Hx   . Mental retardation Neg Hx   . Miscarriages / Stillbirths Neg Hx   . Stroke Neg Hx   . Vision loss Neg Hx   . Varicose Veins Neg Hx     Social History   Tobacco Use  . Smoking status: Current Every Day Smoker    Packs/day: 0.25    Types: Cigarettes  . Smokeless tobacco: Never Used  . Tobacco comment: trying to quit  Substance Use Topics  . Alcohol use: No  . Drug use: Not  Currently    Types: Marijuana    Allergies: No Known Allergies  Medications Prior to Admission  Medication Sig Dispense Refill Last Dose  . naproxen (NAPROSYN) 500 MG tablet Take 500 mg by mouth 3 (three) times daily with meals.   04/04/2018 at Unknown time  . polyethylene glycol powder (GLYCOLAX/MIRALAX) powder Take 17 g by mouth daily. 255 g 0     Review of Systems  Constitutional: Negative for chills, fatigue and fever.  Gastrointestinal: Negative for nausea and vomiting.  Genitourinary: Positive for vaginal discharge. Negative for difficulty urinating and vaginal bleeding.  Musculoskeletal: Negative for back pain.  Neurological: Negative for headaches.  All other systems reviewed and are negative.  Physical Exam   Blood pressure 138/76, pulse 96, temperature 98.7 F (37.1 C), temperature source Oral, resp. rate 16, height 5\' 7"  (1.702 m), weight 105.7  kg, last menstrual period 07/12/2018, SpO2 99 %, unknown if currently breastfeeding.  Physical Exam  Nursing note and vitals reviewed. Constitutional: She is oriented to person, place, and time. She appears well-developed and well-nourished.  Cardiovascular: Normal rate and intact distal pulses.  Respiratory: Effort normal and breath sounds normal.  GI: Soft. Bowel sounds are normal. She exhibits no distension. There is no abdominal tenderness. There is no rebound, no guarding and no CVA tenderness.  Genitourinary:    Vaginal discharge present.     Genitourinary Comments: Thick white vaginal discharge noted on exam. Specimens collected via blind swab   Neurological: She is alert and oriented to person, place, and time.  Skin: Skin is warm and dry.  Psychiatric: She has a normal mood and affect. Her behavior is normal. Judgment and thought content normal.    MAU Course/MDM   --confirmed for patient that penetrative sex might preclude partner's identification as a virgin. Recommended simultaneous testing  Patient Vitals for  the past 24 hrs:  BP Temp Temp src Pulse Resp SpO2 Height Weight  08/07/18 1457 122/70 - - 87 - - - -  08/07/18 1345 138/76 98.7 F (37.1 C) Oral 96 16 99 % 5\' 7"  (1.702 m) 105.7 kg    Results for orders placed or performed during the hospital encounter of 08/07/18 (from the past 24 hour(s))  Urinalysis, Routine w reflex microscopic     Status: Abnormal   Collection Time: 08/07/18  2:03 PM  Result Value Ref Range   Color, Urine YELLOW YELLOW   APPearance HAZY (A) CLEAR   Specific Gravity, Urine 1.025 1.005 - 1.030   pH 5.0 5.0 - 8.0   Glucose, UA NEGATIVE NEGATIVE mg/dL   Hgb urine dipstick NEGATIVE NEGATIVE   Bilirubin Urine NEGATIVE NEGATIVE   Ketones, ur NEGATIVE NEGATIVE mg/dL   Protein, ur NEGATIVE NEGATIVE mg/dL   Nitrite NEGATIVE NEGATIVE   Leukocytes, UA TRACE (A) NEGATIVE   RBC / HPF 0-5 0 - 5 RBC/hpf   WBC, UA 0-5 0 - 5 WBC/hpf   Bacteria, UA RARE (A) NONE SEEN   Squamous Epithelial / LPF 6-10 0 - 5   Mucus PRESENT   Pregnancy, urine POC     Status: Abnormal   Collection Time: 08/07/18  2:06 PM  Result Value Ref Range   Preg Test, Ur POSITIVE (A) NEGATIVE  Wet prep, genital     Status: Abnormal   Collection Time: 08/07/18  2:13 PM  Result Value Ref Range   Yeast Wet Prep HPF POC NONE SEEN NONE SEEN   Trich, Wet Prep NONE SEEN NONE SEEN   Clue Cells Wet Prep HPF POC PRESENT (A) NONE SEEN   WBC, Wet Prep HPF POC FEW (A) NONE SEEN   Sperm NONE SEEN     Meds ordered this encounter  Medications  . Prenatal Vit-Fe Fumarate-FA (PREPLUS) 27-1 MG TABS    Sig: Take 1 tablet by mouth daily.    Dispense:  30 tablet    Refill:  13    Order Specific Question:   Supervising Provider    Answer:   Reva Bores [[id number]]  . metroNIDAZOLE (FLAGYL) 500 MG tablet    Sig: Take 1 tablet (500 mg total) by mouth 2 (two) times daily.    Dispense:  14 tablet    Refill:  0    Order Specific Question:   Supervising Provider    Answer:   Reva Bores [[id number]]    Assessment and Plan   --  22 y.o. G3P1011 at [redacted]w[redacted]d by LMP --Bacterial Vaginosis, rx to pharmacy --Outpatient viability ultrasound ordered --First trimester ectopic/bleeding precautions reviewed with patient --Discharge home in stable condition  F/U: Patient to establish [facility] around 11-13 weeks  Calvert Cantor, CNM 08/07/2018, 3:23 PM

## 2018-08-08 LAB — GC/CHLAMYDIA PROBE AMP (~~LOC~~) NOT AT ARMC
Chlamydia: NEGATIVE
Neisseria Gonorrhea: NEGATIVE

## 2018-09-05 ENCOUNTER — Inpatient Hospital Stay (HOSPITAL_COMMUNITY)
Admission: AD | Admit: 2018-09-05 | Discharge: 2018-09-05 | Disposition: A | Discharge status: Home / Self Care | Payer: Medicaid Other | Attending: Obstetrics & Gynecology | Admitting: Obstetrics & Gynecology

## 2018-09-05 ENCOUNTER — Encounter (HOSPITAL_COMMUNITY): Payer: Self-pay | Admitting: *Deleted

## 2018-09-05 DIAGNOSIS — Z3A01 Less than 8 weeks gestation of pregnancy: Secondary | ICD-10-CM | POA: Diagnosis not present

## 2018-09-05 DIAGNOSIS — F17210 Nicotine dependence, cigarettes, uncomplicated: Secondary | ICD-10-CM | POA: Insufficient documentation

## 2018-09-05 DIAGNOSIS — R3915 Urgency of urination: Secondary | ICD-10-CM

## 2018-09-05 DIAGNOSIS — O99331 Smoking (tobacco) complicating pregnancy, first trimester: Secondary | ICD-10-CM | POA: Insufficient documentation

## 2018-09-05 DIAGNOSIS — R309 Painful micturition, unspecified: Secondary | ICD-10-CM | POA: Diagnosis present

## 2018-09-05 DIAGNOSIS — B9689 Other specified bacterial agents as the cause of diseases classified elsewhere: Secondary | ICD-10-CM | POA: Insufficient documentation

## 2018-09-05 DIAGNOSIS — O23591 Infection of other part of genital tract in pregnancy, first trimester: Secondary | ICD-10-CM | POA: Diagnosis not present

## 2018-09-05 DIAGNOSIS — N760 Acute vaginitis: Secondary | ICD-10-CM

## 2018-09-05 LAB — URINALYSIS, ROUTINE W REFLEX MICROSCOPIC
BACTERIA UA: NONE SEEN
BILIRUBIN URINE: NEGATIVE
Glucose, UA: NEGATIVE mg/dL
Hgb urine dipstick: NEGATIVE
KETONES UR: NEGATIVE mg/dL
Nitrite: NEGATIVE
Protein, ur: NEGATIVE mg/dL
Specific Gravity, Urine: 1.02 (ref 1.005–1.030)
pH: 7 (ref 5.0–8.0)

## 2018-09-05 NOTE — MAU Note (Signed)
Patient c/o having cloudy foul smelling urine and pain on urination.  No pain now, only on urination.  Spotting today, unsure if from vagina or urethra. [redacted]wks pregnant.

## 2018-09-05 NOTE — Discharge Instructions (Signed)
Bacterial Vaginosis    Bacterial vaginosis is a vaginal infection that occurs when the normal balance of bacteria in the vagina is disrupted. It results from an overgrowth of certain bacteria. This is the most common vaginal infection among women ages 15-44.  Because bacterial vaginosis increases your risk for STIs (sexually transmitted infections), getting treated can help reduce your risk for chlamydia, gonorrhea, herpes, and HIV (human immunodeficiency virus). Treatment is also important for preventing complications in pregnant women, because this condition can cause an early (premature) delivery.  What are the causes?  This condition is caused by an increase in harmful bacteria that are normally present in small amounts in the vagina. However, the reason that the condition develops is not fully understood.  What increases the risk?  The following factors may make you more likely to develop this condition:  · Having a new sexual partner or multiple sexual partners.  · Having unprotected sex.  · Douching.  · Having an intrauterine device (IUD).  · Smoking.  · Drug and alcohol abuse.  · Taking certain antibiotic medicines.  · Being pregnant.  You cannot get bacterial vaginosis from toilet seats, bedding, swimming pools, or contact with objects around you.  What are the signs or symptoms?  Symptoms of this condition include:  · Grey or white vaginal discharge. The discharge can also be watery or foamy.  · A fish-like odor with discharge, especially after sexual intercourse or during menstruation.  · Itching in and around the vagina.  · Burning or pain with urination.  Some women with bacterial vaginosis have no signs or symptoms.  How is this diagnosed?  This condition is diagnosed based on:  · Your medical history.  · A physical exam of the vagina.  · Testing a sample of vaginal fluid under a microscope to look for a large amount of bad bacteria or abnormal cells. Your health care provider may use a cotton swab or  a small wooden spatula to collect the sample.  How is this treated?  This condition is treated with antibiotics. These may be given as a pill, a vaginal cream, or a medicine that is put into the vagina (suppository). If the condition comes back after treatment, a second round of antibiotics may be needed.  Follow these instructions at home:  Medicines  · Take over-the-counter and prescription medicines only as told by your health care provider.  · Take or use your antibiotic as told by your health care provider. Do not stop taking or using the antibiotic even if you start to feel better.  General instructions  · If you have a female sexual partner, tell her that you have a vaginal infection. She should see her health care provider and be treated if she has symptoms. If you have a female sexual partner, he does not need treatment.  · During treatment:  ? Avoid sexual activity until you finish treatment.  ? Do not douche.  ? Avoid alcohol as directed by your health care provider.  ? Avoid breastfeeding as directed by your health care provider.  · Drink enough water and fluids to keep your urine clear or pale yellow.  · Keep the area around your vagina and rectum clean.  ? Wash the area daily with warm water.  ? Wipe yourself from front to back after using the toilet.  · Keep all follow-up visits as told by your health care provider. This is important.  How is this prevented?  · Do not   douche.  · Wash the outside of your vagina with warm water only.  · Use protection when having sex. This includes latex condoms and dental dams.  · Limit how many sexual partners you have. To help prevent bacterial vaginosis, it is best to have sex with just one partner (monogamous).  · Make sure you and your sexual partner are tested for STIs.  · Wear cotton or cotton-lined underwear.  · Avoid wearing tight pants and pantyhose, especially during summer.  · Limit the amount of alcohol that you drink.  · Do not use any products that contain  nicotine or tobacco, such as cigarettes and e-cigarettes. If you need help quitting, ask your health care provider.  · Do not use illegal drugs.  Where to find more information  · Centers for Disease Control and Prevention: www.cdc.gov/std  · American Sexual Health Association (ASHA): www.ashastd.org  · U.S. Department of Health and Human Services, Office on Women's Health: www.womenshealth.gov/ or https://www.womenshealth.gov/a-z-topics/bacterial-vaginosis  Contact a health care provider if:  · Your symptoms do not improve, even after treatment.  · You have more discharge or pain when urinating.  · You have a fever.  · You have pain in your abdomen.  · You have pain during sex.  · You have vaginal bleeding between periods.  Summary  · Bacterial vaginosis is a vaginal infection that occurs when the normal balance of bacteria in the vagina is disrupted.  · Because bacterial vaginosis increases your risk for STIs (sexually transmitted infections), getting treated can help reduce your risk for chlamydia, gonorrhea, herpes, and HIV (human immunodeficiency virus). Treatment is also important for preventing complications in pregnant women, because the condition can cause an early (premature) delivery.  · This condition is treated with antibiotic medicines. These may be given as a pill, a vaginal cream, or a medicine that is put into the vagina (suppository).  This information is not intended to replace advice given to you by your health care provider. Make sure you discuss any questions you have with your health care provider.  Document Released: 08/08/2005 Document Revised: 12/12/2016 Document Reviewed: 04/23/2016  [employer] Interactive Patient Education © 2019 [employer]

## 2018-09-05 NOTE — MAU Provider Note (Signed)
History     CSN: [id number]  Arrival date and time: 09/05/18 1128   First Provider Initiated Contact with Patient 09/05/18 1158      Chief Complaint  Patient presents with  . Urinary Tract Infection   Karen Frederick is a 23 y.o. G3P1011 at [redacted]w[redacted]d who presents for Urinary Tract Infection.  Patient presents today with complaint of burning with urination and spotting this morning.  She states her urine cloudy and has smells like medicine.  She denies back pain, but reports an urge to urinate but feels like she is not completely emptying her urine.  Patient states her symptoms started about one week ago and has tried urinary tract relief, for 4 days, with some improvement.  However, she reports that she did not take the pill as directed. She endorses constipation, but reports that she took a bowel movement this morning without usage of stimulants.  She states that the spotting was bright red and was noted with wiping and was self-limiting.  She states she is unsure of whether it was from the urethra or vagina.  No recent sexual activity.  However, she reports that she just started taking her medication for her BV infection, diagnosed on 12/17.       OB History    Gravida  3   Para  1   Term  1   Preterm      AB  1   Living  1     SAB  0   TAB  1   Ectopic      Multiple  0   Live Births  1           Past Medical History:  Diagnosis Date  . Anemia   . Gonorrhea   . Trichomonal vaginitis     Past Surgical History:  Procedure Laterality Date  . HERNIA REPAIR     umbilical    Family History  Problem Relation Age of Onset  . Diabetes Paternal Grandmother   . Cancer Paternal Grandmother        breast with mets  . Alcohol abuse Neg Hx   . Arthritis Neg Hx   . Asthma Neg Hx   . Birth defects Neg Hx   . COPD Neg Hx   . Depression Neg Hx   . Drug abuse Neg Hx   . Early death Neg Hx   . Hearing loss Neg Hx   . Heart disease Neg Hx   . Hyperlipidemia Neg Hx    . Hypertension Neg Hx   . Kidney disease Neg Hx   . Learning disabilities Neg Hx   . Mental illness Neg Hx   . Mental retardation Neg Hx   . Miscarriages / Stillbirths Neg Hx   . Stroke Neg Hx   . Vision loss Neg Hx   . Varicose Veins Neg Hx     Social History   Tobacco Use  . Smoking status: Current Every Day Smoker    Packs/day: 0.25    Types: Cigarettes  . Smokeless tobacco: Never Used  . Tobacco comment: trying to quit  Substance Use Topics  . Alcohol use: No  . Drug use: Not Currently    Types: Marijuana    Comment: last used 08/22/18    Allergies: No Known Allergies  Medications Prior to Admission  Medication Sig Dispense Refill Last Dose  . metroNIDAZOLE (FLAGYL) 500 MG tablet Take 1 tablet (500 mg total) by mouth 2 (two) times daily.  14 tablet 0   . polyethylene glycol powder (GLYCOLAX/MIRALAX) powder Take 17 g by mouth daily. 255 g 0   . Prenatal Vit-Fe Fumarate-FA (PREPLUS) 27-1 MG TABS Take 1 tablet by mouth daily. 30 tablet 13     Review of Systems  Constitutional: Negative for chills and fever.  Respiratory: Negative for cough and shortness of breath.   Gastrointestinal: Positive for constipation and nausea (Resolves w/o medication.). Negative for abdominal pain and vomiting.  Genitourinary: Positive for dysuria, urgency and vaginal bleeding. Negative for vaginal discharge and vaginal pain.   Physical Exam   Blood pressure (!) 114/59, pulse 78, temperature 98.6 F (37 C), temperature source Oral, resp. rate 17, weight 108.2 kg, last menstrual period 07/12/2018, SpO2 100 %, unknown if currently breastfeeding.  Physical Exam  Constitutional: She is oriented to person, place, and time. She appears well-developed and well-nourished.  HENT:  Head: Normocephalic and atraumatic.  Eyes: Conjunctivae are normal.  Neck: Normal range of motion.  Cardiovascular: Normal rate, regular rhythm and normal heart sounds.  Respiratory: Effort normal and breath sounds  normal.  GI: Soft. Bowel sounds are normal.  Musculoskeletal: Normal range of motion.  Neurological: She is alert and oriented to person, place, and time.  Skin: Skin is warm and dry.  Psychiatric: She has a normal mood and affect. Her behavior is normal.    MAU Course  Procedures  MDM PE Labs: UA Education  Assessment and Plan  IUP at 7.6wks Urinary Complaints Known Bacterial Vaginosis  -Exam findings discussed -Will defer pelvic exam under known BV diagnosis. -Strong recommendation with compliance of BV medication. -Discussed effects of BV and symptoms in pregnancy including increased vaginal discharge, spotting, cramping, and odor. -Patient agreeable with deferment of pelvic exam.  -Patient reports care established at [facility] and given phone number and information regarding on-call provider, contacting office for problem visits, etc. -Will await for UA results  Follow Up (12:37 PM) -UA returns normal -Discussed need for proper hydration during pregnancy. -Reiterated need for completion of medication. -Discussed pelvic rest until 72 hours after completion of medication. -Encouraged to return to MAU or contact primary obstetrical provider if symptoms worsen or with onset of new symptoms -No questions or concerns. -Keep appt as scheduled: Sep 11, 2018 -Discharged to home in stable condition   Cherre Robins MSN, CNM 09/05/2018, 11:58 AM

## 2018-09-11 ENCOUNTER — Other Ambulatory Visit: Payer: Self-pay | Admitting: Obstetrics and Gynecology

## 2018-09-11 DIAGNOSIS — K829 Disease of gallbladder, unspecified: Secondary | ICD-10-CM

## 2018-09-18 ENCOUNTER — Other Ambulatory Visit: Payer: Medicaid Other

## 2018-10-24 ENCOUNTER — Other Ambulatory Visit: Payer: Self-pay | Admitting: Obstetrics and Gynecology

## 2018-10-24 DIAGNOSIS — R1010 Upper abdominal pain, unspecified: Secondary | ICD-10-CM

## 2018-10-29 ENCOUNTER — Other Ambulatory Visit: Payer: Medicaid Other

## 2018-10-31 ENCOUNTER — Other Ambulatory Visit: Payer: Self-pay | Admitting: Obstetrics and Gynecology

## 2018-11-01 ENCOUNTER — Ambulatory Visit
Admission: RE | Admit: 2018-11-01 | Discharge: 2018-11-01 | Disposition: A | Discharge status: Home / Self Care | Payer: Medicaid Other | Source: Ambulatory Visit | Attending: Obstetrics and Gynecology | Admitting: Obstetrics and Gynecology

## 2018-11-01 ENCOUNTER — Other Ambulatory Visit: Payer: Self-pay

## 2018-11-01 DIAGNOSIS — R1010 Upper abdominal pain, unspecified: Secondary | ICD-10-CM

## 2019-02-14 ENCOUNTER — Emergency Department (HOSPITAL_COMMUNITY)
Admission: EM | Admit: 2019-02-14 | Discharge: 2019-02-14 | Disposition: A | Discharge status: Home / Self Care | Payer: Medicaid Other | Attending: Emergency Medicine | Admitting: Emergency Medicine

## 2019-02-14 ENCOUNTER — Encounter (HOSPITAL_COMMUNITY): Payer: Self-pay | Admitting: Emergency Medicine

## 2019-02-14 ENCOUNTER — Other Ambulatory Visit: Payer: Self-pay

## 2019-02-14 DIAGNOSIS — A599 Trichomoniasis, unspecified: Secondary | ICD-10-CM | POA: Diagnosis not present

## 2019-02-14 DIAGNOSIS — F17210 Nicotine dependence, cigarettes, uncomplicated: Secondary | ICD-10-CM | POA: Insufficient documentation

## 2019-02-14 DIAGNOSIS — N898 Other specified noninflammatory disorders of vagina: Secondary | ICD-10-CM | POA: Diagnosis present

## 2019-02-14 LAB — POC URINE PREG, ED: Preg Test, Ur: NEGATIVE

## 2019-02-14 LAB — WET PREP, GENITAL
Clue Cells Wet Prep HPF POC: NONE SEEN
Sperm: NONE SEEN
Yeast Wet Prep HPF POC: NONE SEEN

## 2019-02-14 MED ORDER — AZITHROMYCIN 250 MG PO TABS
1000.0000 mg | ORAL_TABLET | Freq: Once | ORAL | Status: AC
  Administered 2019-02-14: 19:00:00 1000 mg via ORAL
  Filled 2019-02-14: qty 4

## 2019-02-14 MED ORDER — CEFTRIAXONE SODIUM 250 MG IJ SOLR
250.0000 mg | Freq: Once | INTRAMUSCULAR | Status: AC
  Administered 2019-02-14: 250 mg via INTRAMUSCULAR
  Filled 2019-02-14: qty 250

## 2019-02-14 MED ORDER — LIDOCAINE HCL (PF) 1 % IJ SOLN
INTRAMUSCULAR | Status: AC
  Administered 2019-02-14: 5 mL
  Filled 2019-02-14: qty 5

## 2019-02-14 MED ORDER — METRONIDAZOLE 500 MG PO TABS
2000.0000 mg | ORAL_TABLET | Freq: Once | ORAL | Status: AC
  Administered 2019-02-14: 2000 mg via ORAL
  Filled 2019-02-14: qty 4

## 2019-02-14 NOTE — ED Triage Notes (Signed)
Pt arrives to ED from home with complaints of white vaginal discharge and foul smelling vaginal odors for the last two days. Pt states she has had the same partner for the last two years.

## 2019-02-14 NOTE — ED Notes (Signed)
Patient verbalizes understanding of discharge instructions. Opportunity for questioning and answers were provided. Armband removed by staff, pt discharged from ED.  

## 2019-02-14 NOTE — ED Provider Notes (Signed)
[facility] Provider Note   CSN: [id number] Arrival date & time: 02/14/19  1701    History   Chief Complaint Chief Complaint  Patient presents with  . Vaginal Discharge    HPI Karen Frederick is a 23 y.o. female.     HPI   23 year old female presents today with vaginal discharge x2 days.  She notes this is white and odorous.  She notes she is sexually active with one partner, last intercourse was June 10.  She notes a history of yeast, BV and trichomoniasis, she notes this feels like trichomoniasis.  She denies any fever nausea vomiting or abdominal pain.  He does note some minor vaginal itching.  Is not on birth control her last menstrual cycle ended 3 days ago.    Past Medical History:  Diagnosis Date  . Anemia   . Gonorrhea   . Trichomonal vaginitis     Patient Active Problem List   Diagnosis Date Noted  . Vaginal delivery 02/14/2016  . Obesity 02/13/2016  . Anemia affecting pregnancy 02/13/2016  . Late prenatal care 02/13/2016    Past Surgical History:  Procedure Laterality Date  . HERNIA REPAIR     umbilical     OB History    Gravida  3   Para  1   Term  1   Preterm      AB  1   Living  1     SAB  0   TAB  1   Ectopic      Multiple  0   Live Births  1            Home Medications    Prior to Admission medications   Medication Sig Start Date End Date Taking? Authorizing Provider  metroNIDAZOLE (FLAGYL) 500 MG tablet Take 1 tablet (500 mg total) by mouth 2 (two) times daily. 08/07/18   Clayton BiblesWeinhold, Samantha C, CNM  polyethylene glycol powder (GLYCOLAX/MIRALAX) powder Take 17 g by mouth daily. 03/29/18   Donette LarryBhambri, Melanie, CNM  Prenatal Vit-Fe Fumarate-FA (PREPLUS) 27-1 MG TABS Take 1 tablet by mouth daily. 08/07/18   Calvert CantorWeinhold, Samantha C, CNM    Family History Family History  Problem Relation Age of Onset  . Diabetes Paternal Grandmother   . Cancer Paternal Grandmother        breast with mets   . Alcohol abuse Neg Hx   . Arthritis Neg Hx   . Asthma Neg Hx   . Birth defects Neg Hx   . COPD Neg Hx   . Depression Neg Hx   . Drug abuse Neg Hx   . Early death Neg Hx   . Hearing loss Neg Hx   . Heart disease Neg Hx   . Hyperlipidemia Neg Hx   . Hypertension Neg Hx   . Kidney disease Neg Hx   . Learning disabilities Neg Hx   . Mental illness Neg Hx   . Mental retardation Neg Hx   . Miscarriages / Stillbirths Neg Hx   . Stroke Neg Hx   . Vision loss Neg Hx   . Varicose Veins Neg Hx     Social History Social History   Tobacco Use  . Smoking status: Current Every Day Smoker    Packs/day: 0.25    Types: Cigarettes  . Smokeless tobacco: Never Used  . Tobacco comment: trying to quit  Substance Use Topics  . Alcohol use: No  . Drug use: Not Currently  Types: Marijuana    Comment: last used 08/22/18     Allergies   Patient has no known allergies.   Review of Systems Review of Systems  All other systems reviewed and are negative.    Physical Exam Updated Vital Signs BP 128/71 (BP Location: Right Arm)   Pulse 96   Temp 98.4 F (36.9 C) (Oral)   Resp 16   LMP 07/12/2018   SpO2 99%   Physical Exam Vitals signs and nursing note reviewed.  Constitutional:      Appearance: She is well-developed.  HENT:     Head: Normocephalic and atraumatic.  Eyes:     General: No scleral icterus.       Right eye: No discharge.        Left eye: No discharge.     Conjunctiva/sclera: Conjunctivae normal.     Pupils: Pupils are equal, round, and reactive to light.  Neck:     Musculoskeletal: Normal range of motion.     Vascular: No JVD.     Trachea: No tracheal deviation.  Pulmonary:     Effort: Pulmonary effort is normal.     Breath sounds: No stridor.  Genitourinary:    Comments: Thin whitish discharge noted, no bleeding or lesions, no CVT Neurological:     Mental Status: She is alert and oriented to person, place, and time.     Coordination: Coordination normal.   Psychiatric:        Behavior: Behavior normal.        Thought Content: Thought content normal.        Judgment: Judgment normal.    ED Treatments / Results  Labs (all labs ordered are listed, but only abnormal results are displayed) Labs Reviewed  WET PREP, GENITAL - Abnormal; Notable for the following components:      Result Value   Trich, Wet Prep PRESENT (*)    WBC, Wet Prep HPF POC FEW (*)    All other components within normal limits  POC URINE PREG, ED  GC/CHLAMYDIA PROBE AMP ([place]) NOT AT [facility]    EKG None  Radiology No results found.  Procedures Procedures  (including critical care time)  Medications Ordered in ED Medications  metroNIDAZOLE (FLAGYL) tablet 2,000 mg (has no administration in time range)  azithromycin (ZITHROMAX) tablet 1,000 mg (has no administration in time range)  cefTRIAXone (ROCEPHIN) injection 250 mg (has no administration in time range)     Initial Impression / Assessment and Plan / ED Course  I have reviewed the triage vital signs and the nursing notes.  Pertinent labs & imaging results that were available during my care of the patient were reviewed by me and considered in my medical decision making (see chart for details).        Labs:   Imaging:  Consults:  Therapeutics: Metronidazole, ceftriaxone, azithromycin  Discharge Meds:   Assessment/Plan: 23 year old female here with STD.  Patient would like prophylactic treatment for gonorrhea and chlamydia as well.  Discharged with strict return precautions.  Sexual partner will be treated.  Return precautions given.  She verbalized understanding and agreement to today's plan.    Final Clinical Impressions(s) / ED Diagnoses   Final diagnoses:  Trichimoniasis    ED Discharge Orders    None       Francee Gentile 02/14/19 1832    Hayden Rasmussen, MD 02/15/19 [phone]

## 2019-02-14 NOTE — Discharge Instructions (Addendum)
Please read attached information. If you experience any new or worsening signs or symptoms please return to the emergency room for evaluation. Please follow-up with your primary care provider or specialist as discussed.  °

## 2019-02-15 LAB — GC/CHLAMYDIA PROBE AMP (~~LOC~~) NOT AT ARMC
Chlamydia: NEGATIVE
Neisseria Gonorrhea: NEGATIVE

## 2019-03-11 ENCOUNTER — Emergency Department (HOSPITAL_COMMUNITY)
Admission: EM | Admit: 2019-03-11 | Discharge: 2019-03-11 | Disposition: A | Discharge status: Home / Self Care | Payer: Medicaid Other | Attending: Emergency Medicine | Admitting: Emergency Medicine

## 2019-03-11 ENCOUNTER — Encounter (HOSPITAL_COMMUNITY): Payer: Self-pay | Admitting: Emergency Medicine

## 2019-03-11 ENCOUNTER — Emergency Department (HOSPITAL_COMMUNITY): Payer: Medicaid Other

## 2019-03-11 ENCOUNTER — Emergency Department (HOSPITAL_COMMUNITY)
Admission: EM | Admit: 2019-03-11 | Discharge: 2019-03-11 | Discharge status: Absent without leave | Payer: Medicaid Other

## 2019-03-11 ENCOUNTER — Other Ambulatory Visit: Payer: Self-pay

## 2019-03-11 DIAGNOSIS — Z23 Encounter for immunization: Secondary | ICD-10-CM | POA: Diagnosis not present

## 2019-03-11 DIAGNOSIS — Y92018 Other place in single-family (private) house as the place of occurrence of the external cause: Secondary | ICD-10-CM | POA: Diagnosis not present

## 2019-03-11 DIAGNOSIS — Y9301 Activity, walking, marching and hiking: Secondary | ICD-10-CM | POA: Diagnosis not present

## 2019-03-11 DIAGNOSIS — S91119A Laceration without foreign body of unspecified toe without damage to nail, initial encounter: Secondary | ICD-10-CM | POA: Insufficient documentation

## 2019-03-11 DIAGNOSIS — F17210 Nicotine dependence, cigarettes, uncomplicated: Secondary | ICD-10-CM | POA: Diagnosis not present

## 2019-03-11 DIAGNOSIS — Y998 Other external cause status: Secondary | ICD-10-CM | POA: Diagnosis not present

## 2019-03-11 DIAGNOSIS — W228XXA Striking against or struck by other objects, initial encounter: Secondary | ICD-10-CM | POA: Diagnosis not present

## 2019-03-11 DIAGNOSIS — S99921A Unspecified injury of right foot, initial encounter: Secondary | ICD-10-CM | POA: Insufficient documentation

## 2019-03-11 MED ORDER — ACETAMINOPHEN 325 MG PO TABS
650.0000 mg | ORAL_TABLET | Freq: Once | ORAL | Status: AC
  Administered 2019-03-11: 650 mg via ORAL
  Filled 2019-03-11: qty 2

## 2019-03-11 MED ORDER — TETANUS-DIPHTH-ACELL PERTUSSIS 5-2.5-18.5 LF-MCG/0.5 IM SUSP
0.5000 mL | Freq: Once | INTRAMUSCULAR | Status: AC
  Administered 2019-03-11: 0.5 mL via INTRAMUSCULAR
  Filled 2019-03-11: qty 0.5

## 2019-03-11 NOTE — ED Notes (Signed)
Pt verbalizes understanding of d/c instructions including follow up, wound care and pain management.

## 2019-03-11 NOTE — ED Triage Notes (Signed)
Reports injuring her 2nd and 3rd toe on her right foot playing with her son.  Bleeding controlled at this time.  Unable to bear weight.

## 2019-03-11 NOTE — Discharge Instructions (Addendum)
1. Medications: alternate ibuprofen and tylenol for pain control, usual home medications °2. Treatment: rest, ice, elevate, drink plenty of fluids, gentle stretching °3. Follow Up: Please follow up with orthopedics as directed or your PCP in 1 week if no improvement for discussion of your diagnoses and further evaluation after today's visit; if you do not have a primary care doctor use the resource guide provided to find one; Please return to the ER for worsening symptoms or other concerns ° °

## 2019-03-11 NOTE — ED Provider Notes (Signed)
[place] EMERGENCY DEPARTMENT Provider Note   CSN: [id number] Arrival date & time: 03/11/19  1801    History   Chief Complaint Chief Complaint  Patient presents with  . Toe Injury    HPI Karen Frederick is a 23 y.o. female with a PMH of anemia, gonorrhea, and trichomonal vaginitis presents with a right toe injury this afternoon. Patient reports she was playing with her son when she hit her right foot while going up the stairs. Patient describes pain as a throbbing sensation and states it is worse with movement. Patient denies taking any medications prior to arrival. Patient states symptoms are better with rest. Patient reports a wound on foot after incident. Patient is unsure of last tetanus shot. Patient reports difficulty with ambulation due to pain. Patient denies fever, chills, numbness, weakness, paresthesias, or edema.      HPI  Past Medical History:  Diagnosis Date  . Anemia   . Gonorrhea   . Trichomonal vaginitis     Patient Active Problem List   Diagnosis Date Noted  . Vaginal delivery 02/14/2016  . Obesity 02/13/2016  . Anemia affecting pregnancy 02/13/2016  . Late prenatal care 02/13/2016    Past Surgical History:  Procedure Laterality Date  . HERNIA REPAIR     umbilical     OB History    Gravida  3   Para  1   Term  1   Preterm      AB  1   Living  1     SAB  0   TAB  1   Ectopic      Multiple  0   Live Births  1            Home Medications    Prior to Admission medications   Medication Sig Start Date End Date Taking? Authorizing Provider  metroNIDAZOLE (FLAGYL) 500 MG tablet Take 1 tablet (500 mg total) by mouth 2 (two) times daily. 08/07/18   Mallie Snooks C, CNM  polyethylene glycol powder (GLYCOLAX/MIRALAX) powder Take 17 g by mouth daily. 03/29/18   Julianne Handler, CNM  Prenatal Vit-Fe Fumarate-FA (PREPLUS) 27-1 MG TABS Take 1 tablet by mouth daily. 08/07/18   Darlina Rumpf, CNM    Family  History Family History  Problem Relation Age of Onset  . Diabetes Paternal Grandmother   . Cancer Paternal Grandmother        breast with mets  . Alcohol abuse Neg Hx   . Arthritis Neg Hx   . Asthma Neg Hx   . Birth defects Neg Hx   . COPD Neg Hx   . Depression Neg Hx   . Drug abuse Neg Hx   . Early death Neg Hx   . Hearing loss Neg Hx   . Heart disease Neg Hx   . Hyperlipidemia Neg Hx   . Hypertension Neg Hx   . Kidney disease Neg Hx   . Learning disabilities Neg Hx   . Mental illness Neg Hx   . Mental retardation Neg Hx   . Miscarriages / Stillbirths Neg Hx   . Stroke Neg Hx   . Vision loss Neg Hx   . Varicose Veins Neg Hx     Social History Social History   Tobacco Use  . Smoking status: Current Every Day Smoker    Packs/day: 0.25    Types: Cigarettes  . Smokeless tobacco: Never Used  . Tobacco comment: trying to quit  Substance Use  Topics  . Alcohol use: No  . Drug use: Not Currently    Types: Marijuana    Comment: last used 08/22/18     Allergies   Patient has no known allergies.   Review of Systems Review of Systems  Constitutional: Negative for activity change, chills, diaphoresis and fever.  Respiratory: Negative for shortness of breath.   Cardiovascular: Negative for leg swelling.  Gastrointestinal: Negative for abdominal pain, diarrhea, nausea and vomiting.  Musculoskeletal: Positive for arthralgias and gait problem. Negative for back pain, joint swelling and myalgias.  Skin: Negative for color change, rash and wound.  Allergic/Immunologic: Negative for immunocompromised state.  Neurological: Negative for weakness and numbness.  Hematological: Does not bruise/bleed easily.    Physical Exam Updated Vital Signs BP 117/68 (BP Location: Left Arm)   Pulse (!) 57   Temp 98.3 F (36.8 C) (Oral)   Resp 14   Ht 5\' 7"  (1.702 m)   Wt 103.9 kg   LMP 07/12/2018   SpO2 100%   BMI 35.87 kg/m   Physical Exam Vitals signs and nursing note reviewed.   Constitutional:      General: She is not in acute distress.    Appearance: She is well-developed. She is not diaphoretic.  HENT:     Head: Normocephalic and atraumatic.  Neck:     Musculoskeletal: Normal range of motion.  Cardiovascular:     Rate and Rhythm: Normal rate and regular rhythm.     Heart sounds: Normal heart sounds. No murmur. No friction rub. No gallop.   Pulmonary:     Effort: Pulmonary effort is normal. No respiratory distress.     Breath sounds: Normal breath sounds. No wheezing or rales.  Abdominal:     Palpations: Abdomen is soft.     Tenderness: There is no abdominal tenderness.  Musculoskeletal: Normal range of motion.     Right knee: Normal. She exhibits normal range of motion, no swelling and no effusion. No tenderness found.     Right ankle: Normal. She exhibits normal range of motion, no swelling and no ecchymosis. No tenderness.     Left ankle: Normal. She exhibits normal range of motion, no swelling and no ecchymosis. No tenderness.     Right foot: Normal range of motion and normal capillary refill. Tenderness, bony tenderness and laceration present. No swelling, crepitus or deformity.     Left foot: Normal. Normal range of motion. No tenderness or bony tenderness.     Comments: 1cm superficial laceration noted over the plantar aspect of the right 3th phalanx of the foot. Bleeding controlled. Tenderness to palpation over 3th and 4th phalanx of right foot. 2+ DP pulses. Sensation intact.   Skin:    General: Skin is warm.     Findings: No erythema or rash.  Neurological:     Mental Status: She is alert.     ED Treatments / Results  Labs (all labs ordered are listed, but only abnormal results are displayed) Labs Reviewed - No data to display  EKG None  Radiology Dg Foot Complete Right  Result Date: 03/11/2019 CLINICAL DATA:  Pain in the second and third toes after falling down steps tonight. Bleeding. EXAM: RIGHT FOOT COMPLETE - 3+ VIEW COMPARISON:   None. FINDINGS: There is no fracture or dislocation. Small calcification in the soft tissues of the medial aspect of the IP joint of the great toe is felt to be due to remote injury. IMPRESSION: No acute abnormality. Electronically Signed   By:  Francene BoyersJames  Maxwell M.D.   On: 03/11/2019 19:06    Procedures .Marland Kitchen.Laceration Repair  Date/Time: 03/11/2019 8:06 PM Performed by: Leretha DykesHernandez, Twylia Oka P, PA-C Authorized by: Leretha DykesHernandez, Idonna Heeren P, PA-C   Consent:    Consent obtained:  Verbal   Consent given by:  Patient   Risks discussed:  Infection, need for additional repair, pain, poor cosmetic result and poor wound healing   Alternatives discussed:  No treatment and delayed treatment Universal protocol:    Procedure explained and questions answered to patient or proxy's satisfaction: yes     Relevant documents present and verified: yes     Test results available and properly labeled: yes     Imaging studies available: yes     Required blood products, implants, devices, and special equipment available: yes     Site/side marked: yes     Immediately prior to procedure, a time out was called: yes     Patient identity confirmed:  Verbally with patient Anesthesia (see MAR for exact dosages):    Anesthesia method:  None Laceration details:    Location:  Foot   Foot location:  Sole of R foot   Length (cm):  1   Depth (mm):  1 Repair type:    Repair type:  Simple Exploration:    Hemostasis achieved with:  Direct pressure   Wound exploration: wound explored through full range of motion     Contaminated: no   Treatment:    Area cleansed with:  Hibiclens   Amount of cleaning:  Standard   Irrigation solution:  Sterile saline   Irrigation method:  Pressure wash   Visualized foreign bodies/material removed: no   Skin repair:    Repair method:  Tissue adhesive Approximation:    Approximation:  Close Post-procedure details:    Dressing:  Bulky dressing   Patient tolerance of procedure:  Tolerated well, no  immediate complications   (including critical care time)  Medications Ordered in ED Medications  Tdap (BOOSTRIX) injection 0.5 mL (0.5 mLs Intramuscular Given 03/11/19 1958)  acetaminophen (TYLENOL) tablet 650 mg (650 mg Oral Given 03/11/19 1957)     Initial Impression / Assessment and Plan / ED Course  I have reviewed the triage vital signs and the nursing notes.  Pertinent labs & imaging results that were available during my care of the patient were reviewed by me and considered in my medical decision making (see chart for details).  Clinical Course as of Mar 10 2010  Mon Mar 11, 2019  1909 There is no fracture or dislocation.  DG Foot Complete Right [AH]    Clinical Course User Index [AH] Leretha DykesHernandez, Zenola Dezarn P, PA-C      Patient presents after a toe injury. Patient X-Ray negative for obvious fracture or dislocation. Pain managed in ED. Pt advised to follow up with orthopedics if symptoms persist for possibility of missed fracture diagnosis. Pressure irrigation performed. Wound explored and base of wound visualized in a bloodless field without evidence of foreign body. Superficial laceration was repaired with tissue adhesive. Laceration occurred < 8 hours prior to repair which was well tolerated. Tdap updated.  Pt has no comorbidities to effect normal wound healing. Pt discharged without antibiotics.  Discussed wound home care with patient and answered questions. Pt to follow-up for wound check in 7 days; they are to return to the ED sooner for signs of infection. Pt is hemodynamically stable with no complaints prior to dc. Patient declined crutches at this time. Conservative therapy recommended and  discussed. Patient will be dc home & is agreeable with above plan.  Final Clinical Impressions(s) / ED Diagnoses   Final diagnoses:  Injury of toe on right foot, initial encounter  Laceration of toe of right foot without foreign body present or damage to nail, unspecified toe, initial encounter     ED Discharge Orders    None       Glade StanfordHernandez, Camey Edell P, PA-C 03/11/19 2011    Gwyneth SproutPlunkett, Whitney, MD 03/12/19 [phone]

## 2019-06-03 ENCOUNTER — Inpatient Hospital Stay (HOSPITAL_COMMUNITY)
Admission: AD | Admit: 2019-06-03 | Discharge: 2019-06-03 | Disposition: A | Discharge status: Home / Self Care | Payer: Medicaid Other | Attending: Obstetrics and Gynecology | Admitting: Obstetrics and Gynecology

## 2019-06-03 ENCOUNTER — Encounter (HOSPITAL_COMMUNITY): Payer: Self-pay

## 2019-06-03 ENCOUNTER — Other Ambulatory Visit: Payer: Self-pay

## 2019-06-03 DIAGNOSIS — R6883 Chills (without fever): Secondary | ICD-10-CM | POA: Diagnosis not present

## 2019-06-03 DIAGNOSIS — O99331 Smoking (tobacco) complicating pregnancy, first trimester: Secondary | ICD-10-CM | POA: Diagnosis not present

## 2019-06-03 DIAGNOSIS — R197 Diarrhea, unspecified: Secondary | ICD-10-CM | POA: Diagnosis not present

## 2019-06-03 DIAGNOSIS — D649 Anemia, unspecified: Secondary | ICD-10-CM | POA: Insufficient documentation

## 2019-06-03 DIAGNOSIS — F17210 Nicotine dependence, cigarettes, uncomplicated: Secondary | ICD-10-CM | POA: Insufficient documentation

## 2019-06-03 DIAGNOSIS — A059 Bacterial foodborne intoxication, unspecified: Secondary | ICD-10-CM | POA: Insufficient documentation

## 2019-06-03 DIAGNOSIS — Z3A01 Less than 8 weeks gestation of pregnancy: Secondary | ICD-10-CM | POA: Insufficient documentation

## 2019-06-03 DIAGNOSIS — Z79899 Other long term (current) drug therapy: Secondary | ICD-10-CM | POA: Diagnosis not present

## 2019-06-03 DIAGNOSIS — O99011 Anemia complicating pregnancy, first trimester: Secondary | ICD-10-CM | POA: Diagnosis not present

## 2019-06-03 DIAGNOSIS — O26891 Other specified pregnancy related conditions, first trimester: Secondary | ICD-10-CM | POA: Diagnosis present

## 2019-06-03 DIAGNOSIS — R109 Unspecified abdominal pain: Secondary | ICD-10-CM | POA: Diagnosis not present

## 2019-06-03 DIAGNOSIS — O219 Vomiting of pregnancy, unspecified: Secondary | ICD-10-CM | POA: Diagnosis not present

## 2019-06-03 LAB — URINALYSIS, ROUTINE W REFLEX MICROSCOPIC
Bilirubin Urine: NEGATIVE
Glucose, UA: NEGATIVE mg/dL
Hgb urine dipstick: NEGATIVE
Ketones, ur: NEGATIVE mg/dL
Leukocytes,Ua: NEGATIVE
Nitrite: NEGATIVE
Protein, ur: NEGATIVE mg/dL
Specific Gravity, Urine: 1.02 (ref 1.005–1.030)
pH: 6 (ref 5.0–8.0)

## 2019-06-03 LAB — COMPREHENSIVE METABOLIC PANEL
ALT: 24 U/L (ref 0–44)
AST: 22 U/L (ref 15–41)
Albumin: 3.6 g/dL (ref 3.5–5.0)
Alkaline Phosphatase: 62 U/L (ref 38–126)
Anion gap: 10 (ref 5–15)
BUN: 8 mg/dL (ref 6–20)
CO2: 21 mmol/L — ABNORMAL LOW (ref 22–32)
Calcium: 8.9 mg/dL (ref 8.9–10.3)
Chloride: 107 mmol/L (ref 98–111)
Creatinine, Ser: 0.86 mg/dL (ref 0.44–1.00)
GFR calc Af Amer: >60 mL/min (ref >60)
GFR calc non Af Amer: >60 mL/min (ref >60)
Glucose, Bld: 121 mg/dL — ABNORMAL HIGH (ref 70–99)
Potassium: 3.9 mmol/L (ref 3.5–5.1)
Sodium: 138 mmol/L (ref 135–145)
Total Bilirubin: 0.3 mg/dL (ref 0.3–1.2)
Total Protein: 7.1 g/dL (ref 6.5–8.1)

## 2019-06-03 LAB — CBC WITH DIFFERENTIAL/PLATELET
Abs Immature Granulocytes: 0.01 10*3/uL (ref 0.00–0.07)
Basophils Absolute: 0 10*3/uL (ref 0.0–0.1)
Basophils Relative: 0 %
Eosinophils Absolute: 0.2 10*3/uL (ref 0.0–0.5)
Eosinophils Relative: 3 %
HCT: 36.9 % (ref 36.0–46.0)
Hemoglobin: 12.2 g/dL (ref 12.0–15.0)
Immature Granulocytes: 0 %
Lymphocytes Relative: 32 %
Lymphs Abs: 2.8 10*3/uL (ref 0.7–4.0)
MCH: 27.7 pg (ref 26.0–34.0)
MCHC: 33.1 g/dL (ref 30.0–36.0)
MCV: 83.9 fL (ref 80.0–100.0)
Monocytes Absolute: 0.5 10*3/uL (ref 0.1–1.0)
Monocytes Relative: 6 %
Neutro Abs: 5.2 10*3/uL (ref 1.7–7.7)
Neutrophils Relative %: 59 %
Platelets: 222 10*3/uL (ref 150–400)
RBC: 4.4 MIL/uL (ref 3.87–5.11)
RDW: 15.7 % — ABNORMAL HIGH (ref 11.5–15.5)
WBC: 8.8 10*3/uL (ref 4.0–10.5)
nRBC: 0 % (ref 0.0–0.2)

## 2019-06-03 LAB — HCG, QUANTITATIVE, PREGNANCY: hCG, Beta Chain, Quant, S: 1164 m[IU]/mL — ABNORMAL HIGH (ref <5)

## 2019-06-03 LAB — POCT PREGNANCY, URINE: Preg Test, Ur: POSITIVE — AB

## 2019-06-03 MED ORDER — LACTATED RINGERS IV BOLUS
1000.0000 mL | Freq: Once | INTRAVENOUS | Status: AC
  Administered 2019-06-03: 06:00:00 1000 mL via INTRAVENOUS

## 2019-06-03 MED ORDER — DICYCLOMINE HCL 10 MG/5ML PO SOLN
10.0000 mg | Freq: Once | ORAL | Status: AC
  Administered 2019-06-03: 10 mg via ORAL
  Filled 2019-06-03: qty 5

## 2019-06-03 MED ORDER — PROMETHAZINE HCL 25 MG/ML IJ SOLN
12.5000 mg | Freq: Once | INTRAMUSCULAR | Status: AC
  Administered 2019-06-03: 06:00:00 12.5 mg via INTRAVENOUS
  Filled 2019-06-03: qty 1

## 2019-06-03 NOTE — Discharge Instructions (Signed)
Food Poisoning Food poisoning is an illness that is caused by eating or drinking contaminated foods or drinks. In most cases, food poisoning is mild and lasts 1-2 days. However, some cases can be serious, especially for people who have weak body defense systems (immune systems), older people, children and infants, and pregnant women. What are the causes? This condition is caused by contaminated food. Foods can become contaminated with viruses, bacteria, parasites, or mold due to:  Poor personal hygiene, such as poor hand-washing practices.  Storing food improperly, such as not refrigerating raw meat.  Using unclean surfaces for preparing, serving, and storing food.  Cooking or eating with unclean utensils. If contaminated food is eaten, viruses, bacteria, or parasites can harm the intestine. This often causes severe diarrhea. The most common causes of food poisoning include:  Viruses, such as: ? Norovirus. ? Rotavirus.  Bacteria, such as: ? Salmonella. ? Listeria. ? E. coli (Escherichia coli).  Parasites, such as: ? Giardia. ? Toxoplasma gondii. What are the signs or symptoms? Symptoms may take several hours to appear after you consume contaminated food or drink. Symptoms include:  Nausea.  Vomiting.  Cramping.  Diarrhea.  Fever and chills.  Muscle aches.  Dehydration. Dehydration can cause you to be tired and thirsty, have a dry mouth, and urinate less frequently. How is this diagnosed? Your health care provider can diagnose food poisoning with your medical history and a physical exam. This will include asking you what you have recently eaten. You may also have tests, including:  Blood tests.  Stool tests. How is this treated? Treatment focuses on relieving your symptoms and making sure that you are hydrated. You may also be given medicines. In severe cases, hospitalization may be required and you may need to receive fluids through an IV. Follow these instructions  at home: Eating and drinking   Drink enough fluids to keep your urine pale yellow. You may need to drink small amounts of clear liquids frequently.  Avoid milk, caffeine, and alcohol.  Ask your health care provider for specific rehydration instructions.  Eat small, frequent meals rather than large meals. Medicines  Take over-the-counter and prescription medicines only as told by your health care provider. Ask your health care provider if you should continue to take any of your regular prescribed and over-the-counter medicines.  If you were prescribed an antibiotic medicine, take it as told by your health care provider. Do not stop taking the antibiotic even if you start to feel better. General instructions   Wash your hands thoroughly before you prepare food and after you go to the bathroom (use the toilet). Make sure that the people who live with you also wash their hands often.  Rest at home until you feel better.  Clean surfaces that you touch with a product that contains chlorine bleach.  Keep all follow-up visits as told by your health care provider. This is important. How is this prevented?  Wash your hands, food preparation surfaces, and utensils thoroughly before and after you handle raw foods.  Use separate food preparation surfaces and storage spaces for raw meat and for fruits and vegetables.  Keep refrigerated foods colder than 43F (5C).  Serve hot foods immediately or keep them heated above 143F (60C).  Store dry foods in cool, dry spaces away from excess heat or moisture. Throw out any foods that do not smell right or are in cans that are bulging.  Follow approved canning procedures.  Heat canned foods thoroughly before you taste  them.  Drink bottled or sterile water when you travel. Get help right away if:  You have difficulty breathing, swallowing, talking, or moving.  You develop blurred vision.  You cannot eat or drink without vomiting.  You  faint.  Your eyes turn yellow.  Your vomiting or diarrhea is persistent.  Abdominal pain develops, increases, or localizes in one small area.  You have a fever.  You have blood or mucus in your stools, or your stools look dark black and tarry.  You have signs of dehydration, such as: ? Dark urine, very little urine, or no urine. ? Cracked lips. ? Not making tears while crying. ? Dry mouth. ? Sunken eyes. ? Sleepiness. ? Weakness. ? Dizziness. These symptoms may represent a serious problem that is an emergency. Do not wait to see if the symptoms will go away. Get medical help right away. Call your local emergency services (911 in the [place] Do not drive yourself to the hospital. Summary  Food poisoning is an illness that is caused by eating or drinking contaminated foods or drinks.  Symptoms may include nausea, vomiting, diarrhea, muscle aches, cramping, fever, chills, and dehydration.  In most cases, food poisoning is mild and lasts 1-2 days.  In severe cases, hospitalization may be required. This information is not intended to replace advice given to you by your health care provider. Make sure you discuss any questions you have with your health care provider. Document Released: 05/06/2004 Document Revised: 05/23/2018 Document Reviewed: 05/23/2018 Elsevier Patient Education  2020 [employer].

## 2019-06-03 NOTE — MAU Note (Signed)
Pt here from [facility] with c/o upper abdominal pain that started Sunday morning after she woke up. Reports that it has gotten worse throughout the day//night. Feels like intestines are twisting. States she was able to go to sleep but woke up out of sleep. Pt took some Pepto Bismol around 2-3pm yesterday. Has had a lot of consistent burping that has a horrible smell. "Smells like something died." She denies vaginal bleeding. Pt reports she is pregnant. LMP: 05/03/19.

## 2019-06-03 NOTE — MAU Provider Note (Signed)
History     CSN: [id number]  Arrival date and time: 06/03/19 8182   First Provider Initiated Contact with Patient 06/03/19 0441      Chief Complaint  Patient presents with  . Possible Pregnancy  . Abdominal Pain   Karen Frederick is a 23 y.o. G4P1011 at 4.3 weeks by Definite LMP of Sept 11, 2020 who receives care at [facility].  She has requested a visit for Friday, but states she has not received confirmation.  Patient presents today for Possible Pregnancy and Abdominal Pain.  She states she has been having abdominal pain since Sunday morning.  Patient states "it feels like all my intestines is bunching up in one or they twisting...they doing something in their."  She reports the pain is constant and is relieved with position changes and worsened with "standing straight up."  Patient rates the pain an 8/10 and took Pepto-Bismol without relief of symptoms.  Patient states she has been "burping consistently today and it smells like rotten eggs."  Patient states that the burping started with the onset of the pain and does not bring relief or worsening of symptoms.  Patient girlfriend reports that she has been eating spicy foods prior to onset of her symptoms.  Patient reports sexual activity in the last 3 days without pain or discomfort.   Of Note patient burped while provider in room and smell is similar to that of sulfur.   Patient also with sudden onset of vomiting and diarrhea with chills.   Patient reports she had [employer] and [employer] nuggets yesterday.       OB History    Gravida  4   Para  1   Term  1   Preterm      AB  1   Living  1     SAB  0   TAB  1   Ectopic      Multiple  0   Live Births  1           Past Medical History:  Diagnosis Date  . Anemia   . Gonorrhea   . Trichomonal vaginitis     Past Surgical History:  Procedure Laterality Date  . HERNIA REPAIR     umbilical    Family History  Problem Relation Age of Onset  .  Diabetes Paternal Grandmother   . Cancer Paternal Grandmother        breast with mets  . Alcohol abuse Neg Hx   . Arthritis Neg Hx   . Asthma Neg Hx   . Birth defects Neg Hx   . COPD Neg Hx   . Depression Neg Hx   . Drug abuse Neg Hx   . Early death Neg Hx   . Hearing loss Neg Hx   . Heart disease Neg Hx   . Hyperlipidemia Neg Hx   . Hypertension Neg Hx   . Kidney disease Neg Hx   . Learning disabilities Neg Hx   . Mental illness Neg Hx   . Mental retardation Neg Hx   . Miscarriages / Stillbirths Neg Hx   . Stroke Neg Hx   . Vision loss Neg Hx   . Varicose Veins Neg Hx     Social History   Tobacco Use  . Smoking status: Current Every Day Smoker    Packs/day: 0.25    Types: Cigarettes  . Smokeless tobacco: Never Used  . Tobacco comment: trying to quit  Substance Use Topics  .  Alcohol use: No  . Drug use: Not Currently    Types: Marijuana    Comment: last used 08/22/18    Allergies: No Known Allergies  Medications Prior to Admission  Medication Sig Dispense Refill Last Dose  . Prenatal Vit-Fe Fumarate-FA (PREPLUS) 27-1 MG TABS Take 1 tablet by mouth daily. 30 tablet 13 06/03/2019 at Unknown time  . metroNIDAZOLE (FLAGYL) 500 MG tablet Take 1 tablet (500 mg total) by mouth 2 (two) times daily. 14 tablet 0   . polyethylene glycol powder (GLYCOLAX/MIRALAX) powder Take 17 g by mouth daily. 255 g 0     Review of Systems  Constitutional: Negative for chills and fever.  Respiratory: Negative for cough and shortness of breath.   Gastrointestinal: Positive for abdominal pain, diarrhea, nausea and vomiting. Negative for constipation.  Genitourinary: Negative for difficulty urinating, dyspareunia, dysuria, vaginal bleeding and vaginal discharge.  Neurological: Negative for dizziness, light-headedness and headaches.   Physical Exam   Blood pressure 109/83, pulse 85, temperature 98.3 F (36.8 C), temperature source Oral, resp. rate 20, height 5\' 7"  (1.702 m), weight 109.3  kg, last menstrual period 05/02/2018, SpO2 100 %, unknown if currently breastfeeding.  Physical Exam  Constitutional: She is oriented to person, place, and time. She appears well-developed and well-nourished. No distress.  HENT:  Head: Normocephalic and atraumatic.  Eyes: Conjunctivae are normal.  Neck: Normal range of motion.  Cardiovascular: Normal rate.  Respiratory: Effort normal.  GI: Soft. Normal appearance. There is abdominal tenderness in the epigastric area and periumbilical area.  Musculoskeletal: Normal range of motion.  Neurological: She is alert and oriented to person, place, and time.  Skin: Skin is warm and dry.  Psychiatric: She has a normal mood and affect. Her behavior is normal.    MAU Course  Procedures Results for orders placed or performed during the hospital encounter of 06/03/19 (from the past 24 hour(s))  Urinalysis, Routine w reflex microscopic     Status: None   Collection Time: 06/03/19  4:08 AM  Result Value Ref Range   Color, Urine YELLOW YELLOW   APPearance CLEAR CLEAR   Specific Gravity, Urine 1.020 1.005 - 1.030   pH 6.0 5.0 - 8.0   Glucose, UA NEGATIVE NEGATIVE mg/dL   Hgb urine dipstick NEGATIVE NEGATIVE   Bilirubin Urine NEGATIVE NEGATIVE   Ketones, ur NEGATIVE NEGATIVE mg/dL   Protein, ur NEGATIVE NEGATIVE mg/dL   Nitrite NEGATIVE NEGATIVE   Leukocytes,Ua NEGATIVE NEGATIVE  Pregnancy, urine POC     Status: Abnormal   Collection Time: 06/03/19  4:09 AM  Result Value Ref Range   Preg Test, Ur POSITIVE (A) NEGATIVE  CBC with Differential/Platelet     Status: Abnormal   Collection Time: 06/03/19  5:24 AM  Result Value Ref Range   WBC 8.8 4.0 - 10.5 K/uL   RBC 4.40 3.87 - 5.11 MIL/uL   Hemoglobin 12.2 12.0 - 15.0 g/dL   HCT 08/03/19 10.2 - 72.5 %   MCV 83.9 80.0 - 100.0 fL   MCH 27.7 26.0 - 34.0 pg   MCHC 33.1 30.0 - 36.0 g/dL   RDW 36.6 (H) 44.0 - 34.7 %   Platelets 222 150 - 400 K/uL   nRBC 0.0 0.0 - 0.2 %   Neutrophils Relative % 59 %    Neutro Abs 5.2 1.7 - 7.7 K/uL   Lymphocytes Relative 32 %   Lymphs Abs 2.8 0.7 - 4.0 K/uL   Monocytes Relative 6 %   Monocytes Absolute 0.5 0.1 -  1.0 K/uL   Eosinophils Relative 3 %   Eosinophils Absolute 0.2 0.0 - 0.5 K/uL   Basophils Relative 0 %   Basophils Absolute 0.0 0.0 - 0.1 K/uL   Immature Granulocytes 0 %   Abs Immature Granulocytes 0.01 0.00 - 0.07 K/uL  hCG, quantitative, pregnancy     Status: Abnormal   Collection Time: 06/03/19  5:24 AM  Result Value Ref Range   hCG, Beta Chain, Quant, S 1,164 (H) <5 mIU/mL  Comprehensive metabolic panel     Status: Abnormal   Collection Time: 06/03/19  5:33 AM  Result Value Ref Range   Sodium 138 135 - 145 mmol/L   Potassium 3.9 3.5 - 5.1 mmol/L   Chloride 107 98 - 111 mmol/L   CO2 21 (L) 22 - 32 mmol/L   Glucose, Bld 121 (H) 70 - 99 mg/dL   BUN 8 6 - 20 mg/dL   Creatinine, Ser 1.910.86 0.44 - 1.00 mg/dL   Calcium 8.9 8.9 - 47.810.3 mg/dL   Total Protein 7.1 6.5 - 8.1 g/dL   Albumin 3.6 3.5 - 5.0 g/dL   AST 22 15 - 41 U/L   ALT 24 0 - 44 U/L   Alkaline Phosphatase 62 38 - 126 U/L   Total Bilirubin 0.3 0.3 - 1.2 mg/dL   GFR calc non Af Amer >60 >60 mL/min   GFR calc Af Amer >60 >60 mL/min   Anion gap 10 5 - 15    MDM Physical Exam Labs: CBC with Diff, hCG Start IV with Bolus AntiEmetic GI Cocktail Assessment and Plan  23 year old, G4P1011  SIUP at 4.3 weeks Abdominal Pain s/t Food Poisoning  -Reviewed POC with patient. -Exam performed and findings discussed.  -Patient informed that symptoms are c/w food poisoning. -Discussed initiation of IV fluids with phenergan for nausea and vomiting. -Will give bentyl for GI spasms once able to tolerate po intake. -Patient agrees with plans and has no questions or concerns.  -Will monitor and reassess.  Cherre RobinsJessica L Tillmon Kisling MSN, CNM 06/03/2019, 4:42 AM   Reassessment (6:29 AM)  -Labs return WNL. -In room to discuss and patient and girlfriend in bed asleep.  -Patient reports  improvement in symptoms with IV bolus and medications. -Patient without questions or concerns. -Encouraged to call primary ob or return to MAU if symptoms worsen or with the onset of new symptoms. -Discharged to home in improved condition.  Cherre RobinsJessica L Erron Wengert MSN, CNM Advanced Practice Provider, [facility]

## 2019-07-15 ENCOUNTER — Other Ambulatory Visit: Payer: Self-pay | Admitting: Obstetrics and Gynecology

## 2019-07-15 ENCOUNTER — Inpatient Hospital Stay (HOSPITAL_COMMUNITY): Payer: Medicaid Other

## 2019-07-15 ENCOUNTER — Other Ambulatory Visit: Payer: Self-pay

## 2019-07-15 ENCOUNTER — Inpatient Hospital Stay (HOSPITAL_COMMUNITY)
Admission: AD | Admit: 2019-07-15 | Discharge: 2019-07-15 | Disposition: A | Discharge status: Home / Self Care | Payer: Medicaid Other | Attending: Obstetrics and Gynecology | Admitting: Obstetrics and Gynecology

## 2019-07-15 ENCOUNTER — Encounter (HOSPITAL_COMMUNITY): Payer: Self-pay

## 2019-07-15 DIAGNOSIS — Z3A10 10 weeks gestation of pregnancy: Secondary | ICD-10-CM | POA: Insufficient documentation

## 2019-07-15 DIAGNOSIS — O26891 Other specified pregnancy related conditions, first trimester: Secondary | ICD-10-CM | POA: Diagnosis not present

## 2019-07-15 DIAGNOSIS — O99011 Anemia complicating pregnancy, first trimester: Secondary | ICD-10-CM | POA: Diagnosis not present

## 2019-07-15 DIAGNOSIS — O209 Hemorrhage in early pregnancy, unspecified: Secondary | ICD-10-CM

## 2019-07-15 DIAGNOSIS — O039 Complete or unspecified spontaneous abortion without complication: Secondary | ICD-10-CM | POA: Insufficient documentation

## 2019-07-15 DIAGNOSIS — R1030 Lower abdominal pain, unspecified: Secondary | ICD-10-CM | POA: Diagnosis not present

## 2019-07-15 DIAGNOSIS — O99331 Smoking (tobacco) complicating pregnancy, first trimester: Secondary | ICD-10-CM | POA: Diagnosis not present

## 2019-07-15 DIAGNOSIS — D649 Anemia, unspecified: Secondary | ICD-10-CM | POA: Insufficient documentation

## 2019-07-15 DIAGNOSIS — F17210 Nicotine dependence, cigarettes, uncomplicated: Secondary | ICD-10-CM | POA: Diagnosis not present

## 2019-07-15 LAB — URINALYSIS, ROUTINE W REFLEX MICROSCOPIC
Bilirubin Urine: NEGATIVE
Glucose, UA: NEGATIVE mg/dL
Ketones, ur: NEGATIVE mg/dL
Leukocytes,Ua: NEGATIVE
Nitrite: NEGATIVE
Protein, ur: NEGATIVE mg/dL
Specific Gravity, Urine: 1.021 (ref 1.005–1.030)
pH: 6 (ref 5.0–8.0)

## 2019-07-15 LAB — WET PREP, GENITAL
Sperm: NONE SEEN
Trich, Wet Prep: NONE SEEN
Yeast Wet Prep HPF POC: NONE SEEN

## 2019-07-15 NOTE — Discharge Instructions (Signed)
Miscarriage °A miscarriage is the loss of an unborn baby (fetus) before the 20th week of pregnancy. Most miscarriages happen during the first 3 months of pregnancy. Sometimes, a miscarriage can happen before a woman knows that she is pregnant. °Having a miscarriage can be an emotional experience. If you have had a miscarriage, talk with your health care provider about any questions you may have about miscarrying, the grieving process, and your plans for future pregnancy. °What are the causes? °A miscarriage may be caused by: °· Problems with the genes or chromosomes of the fetus. These problems make it impossible for the baby to develop normally. They are often the result of random errors that occur early in the development of the baby, and are not passed from parent to child (not inherited). °· Infection of the cervix or uterus. °· Conditions that affect hormone balance in the body. °· Problems with the cervix, such as the cervix opening and thinning before pregnancy is at term (cervical insufficiency). °· Problems with the uterus. These may include: °? A uterus with an abnormal shape. °? Fibroids in the uterus. °? Congenital abnormalities. These are problems that were present at birth. °· Certain medical conditions. °· Smoking, drinking alcohol, or using drugs. °· Injury (trauma). °In many cases, the cause of a miscarriage is not known. °What are the signs or symptoms? °Symptoms of this condition include: °· Vaginal bleeding or spotting, with or without cramps or pain. °· Pain or cramping in the abdomen or lower back. °· Passing fluid, tissue, or blood clots from the vagina. °How is this diagnosed? °This condition may be diagnosed based on: °· A physical exam. °· Ultrasound. °· Blood tests. °· Urine tests. °How is this treated? °Treatment for a miscarriage is sometimes not necessary if you naturally pass all the tissue that was in your uterus. If necessary, this condition may be treated with: °· Dilation and  curettage (D&C). This is a procedure in which the cervix is stretched open and the lining of the uterus (endometrium) is scraped. This is done only if tissue from the fetus or placenta remains in the body (incomplete miscarriage). °· Medicines, such as: °? Antibiotic medicine, to treat infection. °? Medicine to help the body pass any remaining tissue. °? Medicine to reduce (contract) the size of the uterus. These medicines may be given if you have a lot of bleeding. °If you have Rh negative blood and your baby was Rh positive, you will need a shot of a medicine called Rh immunoglobulinto protect your future babies from Rh blood problems. "Rh-negative" and "Rh-positive" refer to whether or not the blood has a specific protein found on the surface of red blood cells (Rh factor). °Follow these instructions at home: °Medicines ° °· Take over-the-counter and prescription medicines only as told by your health care provider. °· If you were prescribed antibiotic medicine, take it as told by your health care provider. Do not stop taking the antibiotic even if you start to feel better. °· Do not take NSAIDs, such as aspirin and ibuprofen, unless they are approved by your health care provider. These medicines can cause bleeding. °Activity °· Rest as directed. Ask your health care provider what activities are safe for you. °· Have someone help with home and family responsibilities during this time. °General instructions °· Keep track of the number of sanitary pads you use each day and how soaked (saturated) they are. Write down this information. °· Monitor the amount of tissue or blood clots that   you pass from your vagina. Save any large amounts of tissue for your health care provider to examine. °· Do not use tampons, douche, or have sex until your health care provider approves. °· To help you and your partner with the process of grieving, talk with your health care provider or seek counseling. °· When you are ready, meet with  your health care provider to discuss any important steps you should take for your health. Also, discuss steps you should take to have a healthy pregnancy in the future. °· Keep all follow-up visits as told by your health care provider. This is important. °Where to find more information °· The American Congress of Obstetricians and Gynecologists: www.acog.org °· [place] Department of Health and Human Services Office of Women’s Health: www.womenshealth.gov °Contact a health care provider if: °· You have a fever or chills. °· You have a foul smelling vaginal discharge. °· You have more bleeding instead of less. °Get help right away if: °· You have severe cramps or pain in your back or abdomen. °· You pass blood clots or tissue from your vagina that is walnut-sized or larger. °· You soak more than 1 regular sanitary pad in an hour. °· You become light-headed or weak. °· You pass out. °· You have feelings of sadness that take over your thoughts, or you have thoughts of hurting yourself. °Summary °· Most miscarriages happen in the first 3 months of pregnancy. Sometimes miscarriage happens before a woman even knows that she is pregnant. °· Follow your health care provider's instruction for home care. Keep all follow-up appointments. °· To help you and your partner with the process of grieving, talk with your health care provider or seek counseling. °This information is not intended to replace advice given to you by your health care provider. Make sure you discuss any questions you have with your health care provider. °Document Released: 02/01/2001 Document Revised: 11/30/2018 Document Reviewed: 09/13/2016 °Elsevier Patient Education © 2020 [employer] ° ° °Managing Pregnancy Loss °Pregnancy loss can happen any time during a pregnancy. Often the cause is not known. It is rarely because of anything you did. Pregnancy loss in early pregnancy (during the first trimester) is called a miscarriage. This type of pregnancy loss is  the most common. Pregnancy loss that happens after 20 weeks of pregnancy is called fetal demise if the baby's heart stops beating before birth. Fetal demise is much less common. Some women experience spontaneous labor shortly after fetal demise resulting in a stillborn birth (stillbirth). °Any pregnancy loss can be devastating. You will need to recover both physically and emotionally. Most women are able to get pregnant again after a pregnancy loss and deliver a healthy baby. °How to manage emotional recovery ° °Pregnancy loss is very hard emotionally. You may feel many different emotions while you grieve. You may feel sad and angry. You may also feel guilty. It is normal to have periods of crying. Emotional recovery can take longer than physical recovery. It is different for everyone. °Taking these steps can help you in managing this loss: °· Remember that it is unlikely you did anything to cause the pregnancy loss. °· Share your thoughts and feelings with friends, family, and your partner. Remember that your partner is also recovering emotionally. °· Make sure you have a good support system. Do not spend too much time alone. °· Meet with a pregnancy loss counselor or join a pregnancy loss support group. °· Get enough sleep and eat a healthy diet. Return   to regular exercise when you have recovered physically. °· Do not use drugs or alcohol to manage your emotions. °· Consider seeing a mental health professional to help you recover emotionally. °· Ask a friend or loved one to help you decide what to do with any clothing and nursery items you received for your baby. °In the case of a stillbirth, many women benefit from taking additional steps in the grieving process. You may want to: °· Hold your baby after the birth. °· Name your baby. °· Request a birth certificate. °· Create a keepsake such as handprints or footprints. °· Dress your baby and have a picture taken. °· Make funeral arrangements. °· Ask for a baptism  or blessing. °Hospitals have staff members who can help you with all these arrangements. °How to recognize emotional stress °It is normal to have emotional stress after a pregnancy loss. But emotional stress that lasts a long time or becomes severe requires treatment. Watch out for these signs of severe emotional stress: °· Sadness, anger, or guilt that is not going away and is interfering with your normal activities. °· Relationship problems that have occurred or gotten worse since the pregnancy loss. °· Signs of depression that last longer than 2 weeks. These may include: °? Sadness. °? Anxiety. °? Hopelessness. °? Loss of interest in activities you enjoy. °? Inability to concentrate. °? Trouble sleeping or sleeping too much. °? Loss of appetite or overeating. °? Thoughts of death or of hurting yourself. °Follow these instructions at home: °· Take over-the-counter and prescription medicines only as told by your health care provider. °· Rest at home until your energy level returns. Return to your normal activities as told by your health care provider. Ask your health care provider what activities are safe for you. °· When you are ready, meet with your health care provider to discuss steps to take for a future pregnancy. °· Keep all follow-up visits as told by your health care provider. This is important. °Where to find support °· To help you and your partner with the process of grieving, talk with your health care provider or seek counseling. °· Consider meeting with others who have experienced pregnancy loss. Ask your health care provider about support groups and resources. °Where to find more information °· [place]. Department of Health and Human Services Office on Women's Health: www.womenshealth.gov °· American Pregnancy Association: www.americanpregnancy.org °Contact a health care provider if: °· You continue to experience grief, sadness, or lack of motivation for everyday activities, and those feelings do not  improve over time. °· You are struggling to recover emotionally, especially if you are using alcohol or substances to help. °Get help right away if: °· You have thoughts of hurting yourself or others. °If you ever feel like you may hurt yourself or others, or have thoughts about taking your own life, get help right away. You can go to your nearest emergency department or call: °· Your local emergency services (911 in the [place] °· A suicide crisis helpline, such as the National Suicide Prevention Lifeline at 1-800-273-8255. This is open 24 hours a day. °Summary °· Any pregnancy loss can be difficult physically and emotionally. °· You may experience many different emotions while you grieve. Emotional recovery can last longer than physical recovery. °· It is normal to have emotional stress after a pregnancy loss. But emotional stress that lasts a long time or becomes severe requires treatment. °· See your health care provider if you are struggling emotionally after a pregnancy   loss. °This information is not intended to replace advice given to you by your health care provider. Make sure you discuss any questions you have with your health care provider. °Document Released: 10/19/2017 Document Revised: 11/28/2018 Document Reviewed: 10/19/2017 °Elsevier Patient Education © 2020 Elsevier Inc. ° °

## 2019-07-15 NOTE — MAU Provider Note (Signed)
Chief Complaint: Vaginal Bleeding   First Provider Initiated Contact with Patient 07/15/19 1102     SUBJECTIVE HPI: Karen Frederick is a 23 y.o. [id number] at [redacted]w[redacted]d by LMP who presents to [facility] Admissions reporting vaginal bleeding. Symptoms started this morning. Reports pink/dark red blood. Saw several small clots in the toilet. Not having to wear a pad. Reports some lower abdominal cramping since bleeding started. States blood is mixed with a mucoid discharge.  No recent intercourse. Is currently in a relationship with a female.  Has had no imaging with this pregnancy. Is an established [facility] patient & has a new OB appointment with them next week.   Location: abdomen Quality: cramping Severity: 4/10 on pain scale Duration: <1 day Timing: intermittent Modifying factors: none Associated signs and symptoms: vaginal bleeding  Past Medical History:  Diagnosis Date  . Anemia   . Gonorrhea   . Trichomonal vaginitis    OB History  Gravida Para Term Preterm AB Living  4 1 1   2 1   SAB TAB Ectopic Multiple Live Births  1 1   0 1    # Outcome Date GA Lbr Len/2nd Weight Sex Delivery Anes PTL Lv  4 Current           3 SAB 10/2018          2 Term 02/13/16 107w5d 12:57 / 03:22 3555 g M Vag-Spont EPI  LIV     Birth Comments: wnl  1 TAB            Past Surgical History:  Procedure Laterality Date  . HERNIA REPAIR     umbilical   Social History   Socioeconomic History  . Marital status: Single    Spouse name: Not on file  . Number of children: Not on file  . Years of education: Not on file  . Highest education level: Not on file  Occupational History  . Not on file  Social Needs  . Financial resource strain: Not hard at all  . Food insecurity    Worry: Patient refused    Inability: Patient refused  . Transportation needs    Medical: Patient refused    Non-medical: Patient refused  Tobacco Use  . Smoking status: Current Every Day Smoker    Packs/day: 0.25    Types:  Cigarettes  . Smokeless tobacco: Never Used  . Tobacco comment: trying to quit  Substance and Sexual Activity  . Alcohol use: No  . Drug use: Not Currently    Types: Marijuana    Comment: last used 08/22/18  . Sexual activity: Yes    Birth control/protection: None  Lifestyle  . Physical activity    Days per week: 0 days    Minutes per session: 0 min  . Stress: Rather much  Relationships  . Social connections    Talks on phone: More than three times a week    Gets together: Once a week    Attends religious service: Never    Active member of club or organization: No    Attends meetings of clubs or organizations: Never    Relationship status: Never married  . Intimate partner violence    Fear of current or ex partner: No    Emotionally abused: No    Physically abused: No    Forced sexual activity: No  Other Topics Concern  . Not on file  Social History Narrative  . Not on file   Family History  Problem Relation Age of  Onset  . Diabetes Paternal Grandmother   . Cancer Paternal Grandmother        breast with mets  . Alcohol abuse Neg Hx   . Arthritis Neg Hx   . Asthma Neg Hx   . Birth defects Neg Hx   . COPD Neg Hx   . Depression Neg Hx   . Drug abuse Neg Hx   . Early death Neg Hx   . Hearing loss Neg Hx   . Heart disease Neg Hx   . Hyperlipidemia Neg Hx   . Hypertension Neg Hx   . Kidney disease Neg Hx   . Learning disabilities Neg Hx   . Mental illness Neg Hx   . Mental retardation Neg Hx   . Miscarriages / Stillbirths Neg Hx   . Stroke Neg Hx   . Vision loss Neg Hx   . Varicose Veins Neg Hx    No current facility-administered medications on file prior to encounter.    Current Outpatient Medications on File Prior to Encounter  Medication Sig Dispense Refill  . Prenatal Vit-Fe Fumarate-FA (PREPLUS) 27-1 MG TABS Take 1 tablet by mouth daily. 30 tablet 13   No Known Allergies  I have reviewed patient's Past Medical Hx, Surgical Hx, Family Hx, Social Hx,  medications and allergies.   Review of Systems  Constitutional: Negative.   Gastrointestinal: Positive for abdominal pain. Negative for constipation, diarrhea, nausea and vomiting.  Genitourinary: Positive for vaginal bleeding and vaginal discharge. Negative for dysuria.    OBJECTIVE Patient Vitals for the past 24 hrs:  BP Temp Temp src Pulse Resp SpO2 Height Weight  07/15/19 1330 117/63 - - 62 - - - -  07/15/19 1030 117/68 98.3 F (36.8 C) Oral 83 20 100 % 5\' 7"  (1.702 m) 110.2 kg   Constitutional: Well-developed, well-nourished female in no acute distress.  Cardiovascular: normal rate & rhythm, no murmur Respiratory: normal rate and effort. Lung sounds clear throughout GI: Abd soft, non-tender, Pos BS x 4. No guarding or rebound tenderness MS: Extremities nontender, no edema, normal ROM Neurologic: Alert and oriented x 4.  GU:     SPECULUM EXAM: NEFG, small amount of tan discharge. No blood. Cervix not friable  BIMANUAL: No CMT. cervix closed; uterus normal size, no adnexal tenderness or masses.    LAB RESULTS Results for orders placed or performed during the hospital encounter of 07/15/19 (from the past 24 hour(s))  Wet prep, genital     Status: Abnormal   Collection Time: 07/15/19 11:29 AM   Specimen: Cervix  Result Value Ref Range   Yeast Wet Prep HPF POC NONE SEEN NONE SEEN   Trich, Wet Prep NONE SEEN NONE SEEN   Clue Cells Wet Prep HPF POC PRESENT (A) NONE SEEN   WBC, Wet Prep HPF POC MANY (A) NONE SEEN   Sperm NONE SEEN   Urinalysis, Routine w reflex microscopic     Status: Abnormal   Collection Time: 07/15/19 12:24 PM  Result Value Ref Range   Color, Urine YELLOW YELLOW   APPearance CLEAR CLEAR   Specific Gravity, Urine 1.021 1.005 - 1.030   pH 6.0 5.0 - 8.0   Glucose, UA NEGATIVE NEGATIVE mg/dL   Hgb urine dipstick SMALL (A) NEGATIVE   Bilirubin Urine NEGATIVE NEGATIVE   Ketones, ur NEGATIVE NEGATIVE mg/dL   Protein, ur NEGATIVE NEGATIVE mg/dL   Nitrite  NEGATIVE NEGATIVE   Leukocytes,Ua NEGATIVE NEGATIVE   RBC / HPF 0-5 0 - 5 RBC/hpf  WBC, UA 0-5 0 - 5 WBC/hpf   Bacteria, UA RARE (A) NONE SEEN   Squamous Epithelial / LPF 6-10 0 - 5   Mucus PRESENT     IMAGING US Ob Less Than 14 Weeks With Ob Transvaginal  Result Date: 07/15/2019 CLINICAL DATA:  Vaginal bleeding with positive beta HCG EXAM: OBSTETRIC <14 WK [place] AND TRANSVAGINAL OB US TECHNIQUE: Both transabdominal and transvaginal ultrasound examinations were performed for complete evaluation of the gestation as well as the maternal uterus, adnexal regions, and pelvic cul-de-sac. Transvaginal technique was performed to assess early pregnancy. COMPARISON:  None. FINDINGS: Intrauterine gestational sac: Visualized. Gestational sac contour somewhat irregular. Yolk sac:  Not visualized Embryo:  Not visualized Cardiac Activity: Not visualized MSD: 29 mm   7 w   6 d Subchorionic hemorrhage:  None visualized. Maternal uterus/adnexae: Cervical os closed. Right ovary measures 2.3 x 2.6 x 2.3 cm. Left ovary measures 2.3 x 1.5 x 1.7 cm. No extrauterine pelvic mass or fluid. IMPRESSION: Gestational sac measures 2.9 cm without demonstrable fetal pole. Findings meet definitive criteria for failed pregnancy with gestational sac of 2.5 cm or greater in size without fetal pole. This follows SRU consensus guidelines: Diagnostic Criteria for Nonviable Pregnancy Early in the First Trimester. [facility] Mis J Med [phone]. 2.  Study otherwise unremarkable. Electronically Signed   By: Bretta Bang III M.D.   On: 07/15/2019 12:30    MAU COURSE Orders Placed This Encounter  Procedures  . Wet prep, genital  . US OB LESS THAN 14 WEEKS WITH OB TRANSVAGINAL  . Urinalysis, Routine w reflex microscopic  . Discharge patient   No orders of the defined types were placed in this encounter.   MDM RH positive  GC/CT & wet prep collected  Ultrasound shows empty IUGS measuring 29 mm. Definitive for failed pregnancy.    Discussed options for management of incomplete AB including expectant management, Cytotec or D&C. Prefers D&C at this time. Spoke with Dr. Su Hilt to inform her of patient's decision; office will call patient to schedule procedure.    ASSESSMENT 1. Miscarriage   2. Vaginal bleeding in pregnancy, first trimester     PLAN Discharge home in stable condition. Bleeding & infection precautions reviewed [facility] to call patient to schedule procedure & follow up appointments GC/CT pending  Follow-up Information    [facility] Follow up.   Specialty: Obstetrics and Gynecology Why: the office will call you to schedule a follow up appointment Contact information: [address] [place] [address] [phone]         Allergies as of 07/15/2019   No Known Allergies     Medication List    TAKE these medications   PrePLUS 27-1 MG Tabs Take 1 tablet by mouth daily.        Judeth Horn, NP 07/15/2019  2:42 PM

## 2019-07-15 NOTE — MAU Note (Addendum)
Pt states that when she woke up this morning at 0915, she went to bathroom, she saw blood mixed with mucous. She also saw a lot of small clots in the toilet. She denies cramping or pain, but says she feels uncomfortable. No fever.  Unsure if still bleeding; is not wearing a pad.

## 2019-07-16 ENCOUNTER — Other Ambulatory Visit: Payer: Self-pay

## 2019-07-16 ENCOUNTER — Other Ambulatory Visit (HOSPITAL_COMMUNITY)
Admission: RE | Admit: 2019-07-16 | Discharge: 2019-07-16 | Disposition: A | Discharge status: Home / Self Care | Payer: Medicaid Other | Source: Ambulatory Visit | Attending: Obstetrics and Gynecology | Admitting: Obstetrics and Gynecology

## 2019-07-16 ENCOUNTER — Encounter (HOSPITAL_COMMUNITY): Payer: Self-pay | Admitting: *Deleted

## 2019-07-16 DIAGNOSIS — Z01812 Encounter for preprocedural laboratory examination: Secondary | ICD-10-CM | POA: Diagnosis not present

## 2019-07-16 DIAGNOSIS — Z20828 Contact with and (suspected) exposure to other viral communicable diseases: Secondary | ICD-10-CM | POA: Diagnosis not present

## 2019-07-16 LAB — GC/CHLAMYDIA PROBE AMP (~~LOC~~) NOT AT ARMC
Chlamydia: NEGATIVE
Comment: NEGATIVE
Comment: NORMAL
Neisseria Gonorrhea: NEGATIVE

## 2019-07-16 LAB — SARS CORONAVIRUS 2 (TAT 6-24 HRS): SARS Coronavirus 2: NEGATIVE

## 2019-07-16 NOTE — Progress Notes (Signed)
Ms Karen Frederick denies any chest pain or shortness of breath.  Patient denies any s/s of Covid 19 for her or the people that live in the home. Ms. Deschamps took a 1300 appointment for Covid 19 test today.  Ms Rudnicki stated that she will go home and quarantine with those that live in the home,

## 2019-07-17 ENCOUNTER — Ambulatory Visit (HOSPITAL_COMMUNITY)
Admission: RE | Admit: 2019-07-17 | Discharge: 2019-07-17 | Disposition: A | Discharge status: Home / Self Care | Payer: Medicaid Other | Source: Ambulatory Visit | Attending: Obstetrics and Gynecology | Admitting: Obstetrics and Gynecology

## 2019-07-17 ENCOUNTER — Ambulatory Visit (HOSPITAL_COMMUNITY): Payer: Medicaid Other | Admitting: Anesthesiology

## 2019-07-17 ENCOUNTER — Encounter (HOSPITAL_COMMUNITY)
Admission: RE | Disposition: A | Discharge status: Home / Self Care | Payer: Self-pay | Source: Ambulatory Visit | Attending: Obstetrics and Gynecology

## 2019-07-17 ENCOUNTER — Other Ambulatory Visit: Payer: Self-pay

## 2019-07-17 DIAGNOSIS — F17210 Nicotine dependence, cigarettes, uncomplicated: Secondary | ICD-10-CM | POA: Insufficient documentation

## 2019-07-17 DIAGNOSIS — O021 Missed abortion: Secondary | ICD-10-CM | POA: Diagnosis not present

## 2019-07-17 DIAGNOSIS — N939 Abnormal uterine and vaginal bleeding, unspecified: Secondary | ICD-10-CM | POA: Diagnosis present

## 2019-07-17 HISTORY — DX: Dyspnea, unspecified: R06.00

## 2019-07-17 HISTORY — PX: DILATION AND EVACUATION: SHX1459

## 2019-07-17 LAB — CBC
HCT: 40 % (ref 36.0–46.0)
Hemoglobin: 12.6 g/dL (ref 12.0–15.0)
MCH: 27.2 pg (ref 26.0–34.0)
MCHC: 31.5 g/dL (ref 30.0–36.0)
MCV: 86.4 fL (ref 80.0–100.0)
Platelets: 242 10*3/uL (ref 150–400)
RBC: 4.63 MIL/uL (ref 3.87–5.11)
RDW: 15.2 % (ref 11.5–15.5)
WBC: 7.8 10*3/uL (ref 4.0–10.5)
nRBC: 0 % (ref 0.0–0.2)

## 2019-07-17 LAB — TYPE AND SCREEN
ABO/RH(D): O POS
Antibody Screen: NEGATIVE

## 2019-07-17 LAB — ABO/RH: ABO/RH(D): O POS

## 2019-07-17 SURGERY — DILATION AND EVACUATION, UTERUS
Anesthesia: Monitor Anesthesia Care

## 2019-07-17 MED ORDER — ONDANSETRON HCL 4 MG/2ML IJ SOLN
INTRAMUSCULAR | Status: DC | PRN
  Administered 2019-07-17: 4 mg via INTRAVENOUS

## 2019-07-17 MED ORDER — MIDAZOLAM HCL 2 MG/2ML IJ SOLN
INTRAMUSCULAR | Status: DC | PRN
  Administered 2019-07-17: 2 mg via INTRAVENOUS

## 2019-07-17 MED ORDER — OXYCODONE-ACETAMINOPHEN 5-325 MG PO TABS: 1.0000 | ORAL_TABLET | ORAL | 0 refills | Status: DC | PRN

## 2019-07-17 MED ORDER — LACTATED RINGERS IV SOLN
INTRAVENOUS | Status: DC
  Administered 2019-07-17: 08:00:00 via INTRAVENOUS

## 2019-07-17 MED ORDER — CYCLOBENZAPRINE HCL 10 MG PO TABS: 10.0000 mg | ORAL_TABLET | Freq: Three times a day (TID) | ORAL | 0 refills | Status: DC | PRN

## 2019-07-17 MED ORDER — FENTANYL CITRATE (PF) 100 MCG/2ML IJ SOLN: 25.0000 ug | INTRAMUSCULAR | Status: DC | PRN

## 2019-07-17 MED ORDER — SILVER NITRATE-POT NITRATE 75-25 % EX MISC
CUTANEOUS | Status: DC | PRN
  Administered 2019-07-17: 1 via TOPICAL

## 2019-07-17 MED ORDER — PROPOFOL 500 MG/50ML IV EMUL
INTRAVENOUS | Status: DC | PRN
  Administered 2019-07-17: 80 ug/kg/min via INTRAVENOUS

## 2019-07-17 MED ORDER — PROPOFOL 10 MG/ML IV BOLUS
INTRAVENOUS | Status: DC | PRN
  Administered 2019-07-17 (×2): 20 mg via INTRAVENOUS

## 2019-07-17 MED ORDER — OXYCODONE-ACETAMINOPHEN 5-325 MG PO TABS
ORAL_TABLET | ORAL | Status: AC
  Filled 2019-07-17: qty 1

## 2019-07-17 MED ORDER — OXYCODONE-ACETAMINOPHEN 5-325 MG PO TABS
1.0000 | ORAL_TABLET | ORAL | Status: DC | PRN
  Administered 2019-07-17: 1 via ORAL

## 2019-07-17 MED ORDER — FENTANYL CITRATE (PF) 250 MCG/5ML IJ SOLN
INTRAMUSCULAR | Status: AC
  Filled 2019-07-17: qty 5

## 2019-07-17 MED ORDER — LIDOCAINE HCL 2 % IJ SOLN
INTRAMUSCULAR | Status: DC | PRN
  Administered 2019-07-17: 10 mL

## 2019-07-17 MED ORDER — ONDANSETRON HCL 4 MG/2ML IJ SOLN: 4.0000 mg | Freq: Once | INTRAMUSCULAR | Status: DC | PRN

## 2019-07-17 MED ORDER — LIDOCAINE HCL 2 % IJ SOLN
INTRAMUSCULAR | Status: AC
  Filled 2019-07-17: qty 20

## 2019-07-17 MED ORDER — MIDAZOLAM HCL 2 MG/2ML IJ SOLN
INTRAMUSCULAR | Status: AC
  Filled 2019-07-17: qty 2

## 2019-07-17 MED ORDER — FENTANYL CITRATE (PF) 100 MCG/2ML IJ SOLN
INTRAMUSCULAR | Status: DC | PRN
  Administered 2019-07-17: 100 ug via INTRAVENOUS

## 2019-07-17 MED ORDER — LIDOCAINE-EPINEPHRINE 2 %-1:100000 IJ SOLN
INTRAMUSCULAR | Status: AC
  Filled 2019-07-17: qty 1

## 2019-07-17 SURGICAL SUPPLY — 22 items
CATH ROBINSON RED A/P 16FR (CATHETERS) ×3 IMPLANT
DECANTER SPIKE VIAL GLASS SM (MISCELLANEOUS) ×3 IMPLANT
FILTER UTR ASPR ASSEMBLY (MISCELLANEOUS) ×3 IMPLANT
GLOVE BIO SURGEON STRL SZ7.5 (GLOVE) ×3 IMPLANT
GLOVE BIOGEL PI IND STRL 7.0 (GLOVE) ×1 IMPLANT
GLOVE BIOGEL PI IND STRL 7.5 (GLOVE) ×1 IMPLANT
GLOVE BIOGEL PI INDICATOR 7.0 (GLOVE) ×2
GLOVE BIOGEL PI INDICATOR 7.5 (GLOVE) ×2
GOWN STRL REUS W/ TWL LRG LVL3 (GOWN DISPOSABLE) ×2 IMPLANT
GOWN STRL REUS W/TWL LRG LVL3 (GOWN DISPOSABLE) ×6
HOSE CONNECTING 18IN BERKELEY (TUBING) ×3 IMPLANT
KIT BERKELEY 1ST TRIMESTER 3/8 (MISCELLANEOUS) ×6 IMPLANT
NS IRRIG 1000ML POUR BTL (IV SOLUTION) ×3 IMPLANT
PACK VAGINAL MINOR WOMEN LF (CUSTOM PROCEDURE TRAY) ×3 IMPLANT
PAD OB MATERNITY 4.3X12.25 (PERSONAL CARE ITEMS) ×3 IMPLANT
SET BERKELEY SUCTION TUBING (SUCTIONS) ×3 IMPLANT
TOWEL GREEN STERILE FF (TOWEL DISPOSABLE) ×6 IMPLANT
UNDERPAD 30X36 HEAVY ABSORB (UNDERPADS AND DIAPERS) ×3 IMPLANT
VACURETTE 10 RIGID CVD (CANNULA) IMPLANT
VACURETTE 7MM CVD STRL WRAP (CANNULA) IMPLANT
VACURETTE 8 RIGID CVD (CANNULA) IMPLANT
VACURETTE 9 RIGID CVD (CANNULA) IMPLANT

## 2019-07-17 NOTE — Anesthesia Postprocedure Evaluation (Signed)
Anesthesia Post Note  Patient: Museum/gallery conservator M Trautmann  Procedure(s) Performed: DILATATION AND EVACUATION (N/A )     Patient location during evaluation: PACU Anesthesia Type: MAC Level of consciousness: awake and alert Pain management: pain level controlled Vital Signs Assessment: post-procedure vital signs reviewed and stable Respiratory status: spontaneous breathing, nonlabored ventilation, respiratory function stable and patient connected to nasal cannula oxygen Cardiovascular status: stable and blood pressure returned to baseline Postop Assessment: no apparent nausea or vomiting Anesthetic complications: no    Last Vitals:  Vitals:   07/17/19 1030 07/17/19 1045  BP: 122/77 118/77  Pulse: 76 (!) 52  Resp: 12 17  Temp:  36.7 C  SpO2: 99% 100%    Last Pain:  Vitals:   07/17/19 1045  TempSrc:   PainSc: 4                  Tiajuana Amass

## 2019-07-17 NOTE — Op Note (Signed)
Preop Diagnosis: missed abortion   Postop Diagnosis: missed abortion   Procedure: DILATATION AND EVACUATION   Anesthesia: Choice   Anesthesiologist: Suzette Battiest, MD   Attending: Everett Graff, MD   Assistant: N/a  Findings: Minimal POCs, pt came to OR passing large clots  Pathology: Endometrial curettings  Fluids: 1000 cc  UOP: 50 cc  EBL: 25 cc  Complications: None  Procedure: The patient was taken to the operating room after the risks benefits and alternatives were discussed with the patient, the patient verbalized understanding and consent signed and witnessed.  The patient was placed under MAC anesthesia, prepped and draped in the normal sterile fashion and a time out was performed.  A bivalve speculum was placed in the patient's vagina and the anterior lip of the cervix grasped with a single-tooth tenaculum. A paracervical block was administered using a total of 10 cc of 2% lidocaine.  The uterus was sounded to 8 cm and a size 7 suction curette was used. Suction curettage was performed until minimal tissue returned. Sharp curettage was performed until a gritty texture was noted. Suction curettage was performed once again to remove any remaining debris. All instruments were removed. The count was correct. The patient was transferred to the recovery room in good condition.

## 2019-07-17 NOTE — Anesthesia Preprocedure Evaluation (Signed)
Anesthesia Evaluation  Patient identified by MRN, date of birth, ID band Patient awake    Reviewed: Allergy & Precautions, NPO status , Patient's Chart, lab work & pertinent test results  Airway Mallampati: II  TM Distance: >3 FB Neck ROM: Full    Dental  (+) Dental Advisory Given   Pulmonary Current Smoker and Patient abstained from smoking.,    breath sounds clear to auscultation       Cardiovascular negative cardio ROS   Rhythm:Regular Rate:Normal     Neuro/Psych negative neurological ROS     GI/Hepatic negative GI ROS, Neg liver ROS,   Endo/Other  negative endocrine ROS  Renal/GU negative Renal ROS     Musculoskeletal   Abdominal   Peds  Hematology negative hematology ROS (+)   Anesthesia Other Findings   Reproductive/Obstetrics                             Lab Results  Component Value Date   WBC 7.8 07/17/2019   HGB 12.6 07/17/2019   HCT 40.0 07/17/2019   MCV 86.4 07/17/2019   PLT 242 07/17/2019   Lab Results  Component Value Date   CREATININE 0.86 06/03/2019   BUN 8 06/03/2019   NA 138 06/03/2019   K 3.9 06/03/2019   CL 107 06/03/2019   CO2 21 (L) 06/03/2019    Anesthesia Physical Anesthesia Plan  ASA: I  Anesthesia Plan: MAC   Post-op Pain Management:    Induction: Intravenous  PONV Risk Score and Plan: 1 and Propofol infusion, Ondansetron and Treatment may vary due to age or medical condition  Airway Management Planned: Natural Airway and Simple Face Mask  Additional Equipment:   Intra-op Plan:   Post-operative Plan:   Informed Consent: I have reviewed the patients History and Physical, chart, labs and discussed the procedure including the risks, benefits and alternatives for the proposed anesthesia with the patient or authorized representative who has indicated his/her understanding and acceptance.     Dental advisory given  Plan Discussed with:  CRNA  Anesthesia Plan Comments:         Anesthesia Quick Evaluation

## 2019-07-17 NOTE — H&P (Addendum)
Chief Complaint: Vaginal Bleeding   First Provider Initiated Contact with Patient 07/15/19 1102     SUBJECTIVE HPI: Karen Frederick is a 23 y.o. [id number] at [redacted]w[redacted]d by LMP who presents to [facility] Admissions reporting vaginal bleeding. Symptoms started this morning. Reports pink/dark red blood. Saw several small clots in the toilet. Not having to wear a pad. Reports some lower abdominal cramping since bleeding started. States blood is mixed with a mucoid discharge.  No recent intercourse. Is currently in a relationship with a female.  Has had no imaging with this pregnancy. Is an established [facility] patient & has a new OB appointment with them next week.   Location: abdomen Quality: cramping Severity: 4/10 on pain scale Duration: <1 day Timing: intermittent Modifying factors: none Associated signs and symptoms: vaginal bleeding      Past Medical History:  Diagnosis Date  . Anemia   . Gonorrhea   . Trichomonal vaginitis                    OB History  Gravida Para Term Preterm AB Living  4 1 1   2 1   SAB TAB Ectopic Multiple Live Births     1 1   0 1       # Outcome Date GA Lbr Len/2nd Weight Sex Delivery Anes PTL Lv  4 Current           3 SAB 10/2018          2 Term 02/13/16 [redacted]w[redacted]d 12:57 / 03:22 3555 g M Vag-Spont EPI  LIV     Birth Comments: wnl  1 TAB                 Past Surgical History:  Procedure Laterality Date  . HERNIA REPAIR     umbilical   Social History        Socioeconomic History  . Marital status: Single    Spouse name: Not on file  . Number of children: Not on file  . Years of education: Not on file  . Highest education level: Not on file  Occupational History  . Not on file  Social Needs  . Financial resource strain: Not hard at all  . Food insecurity    Worry: Patient refused    Inability: Patient refused  . Transportation needs    Medical: Patient refused    Non-medical: Patient refused   Tobacco Use  . Smoking status: Current Every Day Smoker    Packs/day: 0.25    Types: Cigarettes  . Smokeless tobacco: Never Used  . Tobacco comment: trying to quit  Substance and Sexual Activity  . Alcohol use: No  . Drug use: Not Currently    Types: Marijuana    Comment: last used 08/22/18  . Sexual activity: Yes    Birth control/protection: None  Lifestyle  . Physical activity    Days per week: 0 days    Minutes per session: 0 min  . Stress: Rather much  Relationships  . Social connections    Talks on phone: More than three times a week    Gets together: Once a week    Attends religious service: Never    Active member of club or organization: No    Attends meetings of clubs or organizations: Never    Relationship status: Never married  . Intimate partner violence    Fear of current or ex partner: No    Emotionally abused: No    Physically  abused: No    Forced sexual activity: No  Other Topics Concern  . Not on file  Social History Narrative  . Not on file        Family History  Problem Relation Age of Onset  . Diabetes Paternal Grandmother   . Cancer Paternal Grandmother        breast with mets  . Alcohol abuse Neg Hx   . Arthritis Neg Hx   . Asthma Neg Hx   . Birth defects Neg Hx   . COPD Neg Hx   . Depression Neg Hx   . Drug abuse Neg Hx   . Early death Neg Hx   . Hearing loss Neg Hx   . Heart disease Neg Hx   . Hyperlipidemia Neg Hx   . Hypertension Neg Hx   . Kidney disease Neg Hx   . Learning disabilities Neg Hx   . Mental illness Neg Hx   . Mental retardation Neg Hx   . Miscarriages / Stillbirths Neg Hx   . Stroke Neg Hx   . Vision loss Neg Hx   . Varicose Veins Neg Hx    No current facility-administered medications on file prior to encounter.          Current Outpatient Medications on File Prior to Encounter  Medication Sig Dispense Refill  . Prenatal Vit-Fe Fumarate-FA (PREPLUS)  27-1 MG TABS Take 1 tablet by mouth daily. 30 tablet 13   No Known Allergies  I have reviewed patient's Past Medical Hx, Surgical Hx, Family Hx, Social Hx, medications and allergies.   Review of Systems  Constitutional: Negative.   Gastrointestinal: Positive for abdominal pain. Negative for constipation, diarrhea, nausea and vomiting.  Genitourinary: Positive for vaginal bleeding and vaginal discharge. Negative for dysuria.    OBJECTIVE Patient Vitals for the past 24 hrs:  BP Temp Temp src Pulse Resp SpO2 Height Weight  07/15/19 1330 117/63 - - 62 - - - -  07/15/19 1030 117/68 98.3 F (36.8 C) Oral 83 20 100 %  (1.702 m) 110.2 kg   Constitutional: Well-developed, well-nourished female in no acute distress.  Cardiovascular: normal rate & rhythm, no murmur Respiratory: normal rate and effort. Lung sounds clear throughout GI: Abd soft, non-tender, Pos BS x 4. No guarding or rebound tenderness MS: Extremities nontender, no edema, normal ROM Neurologic: Alert and oriented x 4.  GU:     SPECULUM EXAM: NEFG, small amount of tan discharge. No blood. Cervix not friable             BIMANUAL: No CMT. cervix closed; uterus normal size, no adnexal tenderness or masses.       LAB RESULTS Lab Results Last 24 Hours       Results for orders placed or performed during the hospital encounter of 07/15/19 (from the past 24 hour(s))  Wet prep, genital     Status: Abnormal   Collection Time: 07/15/19 11:29 AM   Specimen: Cervix  Result Value Ref Range   Yeast Wet Prep HPF POC NONE SEEN NONE SEEN   Trich, Wet Prep NONE SEEN NONE SEEN   Clue Cells Wet Prep HPF POC PRESENT (A) NONE SEEN   WBC, Wet Prep HPF POC MANY (A) NONE SEEN   Sperm NONE SEEN   Urinalysis, Routine w reflex microscopic     Status: Abnormal   Collection Time: 07/15/19 12:24 PM  Result Value Ref Range   Color, Urine YELLOW YELLOW   APPearance CLEAR CLEAR  Specific Gravity, Urine 1.021 1.005 - 1.030    pH 6.0 5.0 - 8.0   Glucose, UA NEGATIVE NEGATIVE mg/dL   Hgb urine dipstick SMALL (A) NEGATIVE   Bilirubin Urine NEGATIVE NEGATIVE   Ketones, ur NEGATIVE NEGATIVE mg/dL   Protein, ur NEGATIVE NEGATIVE mg/dL   Nitrite NEGATIVE NEGATIVE   Leukocytes,Ua NEGATIVE NEGATIVE   RBC / HPF 0-5 0 - 5 RBC/hpf   WBC, UA 0-5 0 - 5 WBC/hpf   Bacteria, UA RARE (A) NONE SEEN   Squamous Epithelial / LPF 6-10 0 - 5   Mucus PRESENT       IMAGING US Ob Less Than 14 Weeks With Ob Transvaginal  Result Date: 07/15/2019 CLINICAL DATA:  Vaginal bleeding with positive beta HCG EXAM: OBSTETRIC <14 WK [place] AND TRANSVAGINAL OB US TECHNIQUE: Both transabdominal and transvaginal ultrasound examinations were performed for complete evaluation of the gestation as well as the maternal uterus, adnexal regions, and pelvic cul-de-sac. Transvaginal technique was performed to assess early pregnancy. COMPARISON:  None. FINDINGS: Intrauterine gestational sac: Visualized. Gestational sac contour somewhat irregular. Yolk sac:  Not visualized Embryo:  Not visualized Cardiac Activity: Not visualized MSD: 29 mm   7 w   6 d Subchorionic hemorrhage:  None visualized. Maternal uterus/adnexae: Cervical os closed. Right ovary measures 2.3 x 2.6 x 2.3 cm. Left ovary measures 2.3 x 1.5 x 1.7 cm. No extrauterine pelvic mass or fluid. IMPRESSION: Gestational sac measures 2.9 cm without demonstrable fetal pole. Findings meet definitive criteria for failed pregnancy with gestational sac of 2.5 cm or greater in size without fetal pole. This follows SRU consensus guidelines: Diagnostic Criteria for Nonviable Pregnancy Early in the First Trimester. [facility] Mis J Med [phone]. 2.  Study otherwise unremarkable. Electronically Signed   By: Bretta Bang III M.D.   On: 07/15/2019 12:30    MAU COURSE    Orders Placed This Encounter  Procedures  . Wet prep, genital  . US OB LESS THAN 14 WEEKS WITH OB TRANSVAGINAL  . Urinalysis,  Routine w reflex microscopic  . Discharge patient   No orders of the defined types were placed in this encounter.   MDM RH positive  GC/CT & wet prep collected  Ultrasound shows empty IUGS measuring 29 mm. Definitive for failed pregnancy.   Discussed options for management of incomplete AB including expectant management, Cytotec or D&C. Prefers D&C at this time. Spoke with Dr. Su Hilt to inform her of patient's decision; office will call patient to schedule procedure.    ASSESSMENT 1. Miscarriage   2. Vaginal bleeding in pregnancy, first trimester     PLAN Discharge home in stable condition. Bleeding & infection precautions reviewed [facility] to call patient to schedule procedure & follow up appointments GC/CT pending     Follow-up Information    [facility] Follow up.   Specialty: Obstetrics and Gynecology Why: the office will call you to schedule a follow up appointment Contact information: [address] [place] [address] [phone]         Allergies as of 07/15/2019   No Known Allergies        Medication List    TAKE these medications   PrePLUS 27-1 MG Tabs Take 1 tablet by mouth daily.        Judeth Horn, NP 07/15/2019  2:42 PM   Addendum Pt s/p visit to MAU as above note states and diagnosed with missed ab.  Pt reports some cramping and bleeding  this morning and would like to proceed with suction D&E.  R/B/A reviewed with patient and consent signed and witnessed.  O+.  Pt says she itches with ibuprofen and requests muscle relaxant after.  Questions answered.

## 2019-07-17 NOTE — Anesthesia Procedure Notes (Signed)
Procedure Name: MAC Date/Time: 07/17/2019 9:43 AM Performed by: Janace Litten, CRNA Pre-anesthesia Checklist: Patient identified, Emergency Drugs available, Suction available and Patient being monitored Patient Re-evaluated:Patient Re-evaluated prior to induction Oxygen Delivery Method: Simple face mask

## 2019-07-17 NOTE — Transfer of Care (Signed)
Immediate Anesthesia Transfer of Care Note  Patient: Karen Frederick  Procedure(s) Performed: DILATATION AND EVACUATION (N/A )  Patient Location: PACU  Anesthesia Type:MAC  Level of Consciousness: awake, alert  and patient cooperative  Airway & Oxygen Therapy: Patient Spontanous Breathing  Post-op Assessment: Report given to RN and Post -op Vital signs reviewed and stable  Post vital signs: Reviewed and stable  Last Vitals:  Vitals Value Taken Time  BP 111/72 07/17/19 1016  Temp    Pulse 77 07/17/19 1018  Resp 22 07/17/19 1018  SpO2 100 % 07/17/19 1018  Vitals shown include unvalidated device data.  Last Pain:  Vitals:   07/17/19 0753  TempSrc:   PainSc: 10-Worst pain ever      Patients Stated Pain Goal: 3 (56/38/75 6433)  Complications: No apparent anesthesia complications

## 2019-07-18 ENCOUNTER — Encounter (HOSPITAL_COMMUNITY): Payer: Self-pay | Admitting: Obstetrics and Gynecology

## 2019-07-19 LAB — SURGICAL PATHOLOGY

## 2019-07-25 ENCOUNTER — Other Ambulatory Visit: Payer: Self-pay

## 2019-07-25 ENCOUNTER — Encounter (HOSPITAL_COMMUNITY): Payer: Self-pay | Admitting: Emergency Medicine

## 2019-07-25 ENCOUNTER — Emergency Department (HOSPITAL_COMMUNITY)
Admission: EM | Admit: 2019-07-25 | Discharge: 2019-07-25 | Disposition: A | Discharge status: Home / Self Care | Payer: Medicaid Other | Attending: Emergency Medicine | Admitting: Emergency Medicine

## 2019-07-25 DIAGNOSIS — F17210 Nicotine dependence, cigarettes, uncomplicated: Secondary | ICD-10-CM | POA: Diagnosis not present

## 2019-07-25 DIAGNOSIS — B9689 Other specified bacterial agents as the cause of diseases classified elsewhere: Secondary | ICD-10-CM | POA: Diagnosis not present

## 2019-07-25 DIAGNOSIS — Z9889 Other specified postprocedural states: Secondary | ICD-10-CM | POA: Insufficient documentation

## 2019-07-25 DIAGNOSIS — N760 Acute vaginitis: Secondary | ICD-10-CM | POA: Diagnosis not present

## 2019-07-25 DIAGNOSIS — N899 Noninflammatory disorder of vagina, unspecified: Secondary | ICD-10-CM | POA: Diagnosis present

## 2019-07-25 LAB — WET PREP, GENITAL
Sperm: NONE SEEN
Trich, Wet Prep: NONE SEEN
Yeast Wet Prep HPF POC: NONE SEEN

## 2019-07-25 MED ORDER — METRONIDAZOLE 500 MG PO TABS: 500.0000 mg | ORAL_TABLET | Freq: Two times a day (BID) | ORAL | 0 refills | Status: DC

## 2019-07-25 NOTE — ED Notes (Signed)
Pt unable to stay for discharge vitals. States has to leave for work.

## 2019-07-25 NOTE — ED Triage Notes (Signed)
Pt. Stated, I had a D&C the day after Thanksgiving, and 3 days ago I started having a really foul odor really bad. I have no bleeding and feels like its cramping

## 2019-07-25 NOTE — Discharge Instructions (Signed)
Take the Flagyl as directed.  Complete the entire course of medication regardless of symptom improvement to prevent worsening or recurrence of your infection. Return to the ED if you start to have worsening pain, bleeding, lightheadedness, shortness of breath or chest pain.

## 2019-07-25 NOTE — ED Notes (Signed)
Patient Alert and oriented to baseline. Stable and ambulatory to baseline. Patient verbalized understanding of the discharge instructions.  Patient belongings were taken by the patient.   

## 2019-07-25 NOTE — ED Provider Notes (Signed)
[place] EMERGENCY DEPARTMENT Provider Note   CSN: [id number] Arrival date & time: 07/25/19  1704     History   Chief Complaint Chief Complaint  Patient presents with  . Vaginal Discharge  . Vaginitis    HPI Karen Frederick is a 23 y.o. female with a past medical history of obesity, status post D&C on 07/17/2019 presenting to the ED with a chief complaint of vaginal discharge.  States that after her D&C procedure, 3 days ago her bleeding has resolved.  However 3 days ago she did start having vaginal discharge and foul odor.  She was told to contact her provider if this happened.  She tried to get a follow-up appointment with her GYN doctor but was told to come to the ED because they did not have an appointment until January.  She reports abdominal cramping "that feels like I am on my cycle."  States that this has been happening since the [facility].  She denies any lightheadedness, dysuria, vomiting, changes to bowel movements, fever.     HPI  Past Medical History:  Diagnosis Date  . Anemia    with pregnacy  . Dyspnea   . Gonorrhea   . Trichomonal vaginitis     Patient Active Problem List   Diagnosis Date Noted  . Vaginal delivery 02/14/2016  . Obesity 02/13/2016  . Anemia affecting pregnancy 02/13/2016  . Late prenatal care 02/13/2016    Past Surgical History:  Procedure Laterality Date  . DILATION AND EVACUATION N/A 07/17/2019   Procedure: DILATATION AND EVACUATION;  Surgeon: Everett Graff, MD;  Location: [place];  Service: Gynecology;  Laterality: N/A;  . HERNIA REPAIR     umbilical- age 38     OB History    Gravida  4   Para  1   Term  1   Preterm      AB  2   Living  1     SAB  1   TAB  1   Ectopic      Multiple  0   Live Births  1            Home Medications    Prior to Admission medications   Medication Sig Start Date End Date Taking? Authorizing Provider  cyclobenzaprine (FLEXERIL) 10 MG tablet Take 1 tablet (10 mg  total) by mouth every 8 (eight) hours as needed (pain or cramping). 07/17/19   Everett Graff, MD  metroNIDAZOLE (FLAGYL) 500 MG tablet Take 1 tablet (500 mg total) by mouth 2 (two) times daily. 07/25/19   Shalonda Sachse, PA-C  oxyCODONE-acetaminophen (PERCOCET) 5-325 MG tablet Take 1 tablet by mouth every 4 (four) hours as needed for severe pain. 07/17/19   Everett Graff, MD    Family History Family History  Problem Relation Age of Onset  . Diabetes Paternal Grandmother   . Cancer Paternal Grandmother        breast with mets  . Alcohol abuse Neg Hx   . Arthritis Neg Hx   . Asthma Neg Hx   . Birth defects Neg Hx   . COPD Neg Hx   . Depression Neg Hx   . Drug abuse Neg Hx   . Early death Neg Hx   . Hearing loss Neg Hx   . Heart disease Neg Hx   . Hyperlipidemia Neg Hx   . Hypertension Neg Hx   . Kidney disease Neg Hx   . Learning disabilities Neg Hx   .  Mental illness Neg Hx   . Mental retardation Neg Hx   . Miscarriages / Stillbirths Neg Hx   . Stroke Neg Hx   . Vision loss Neg Hx   . Varicose Veins Neg Hx     Social History Social History   Tobacco Use  . Smoking status: Current Every Day Smoker    Packs/day: 0.25    Years: 8.00    Pack years: 2.00    Types: Cigarettes  . Smokeless tobacco: Never Used  . Tobacco comment: trying to quit  Substance Use Topics  . Alcohol use: No  . Drug use: Not Currently    Types: Marijuana    Comment: last used 08/22/18     Allergies   Patient has no known allergies.   Review of Systems Review of Systems  Constitutional: Negative for appetite change, chills and fever.  HENT: Negative for ear pain, rhinorrhea, sneezing and sore throat.   Eyes: Negative for photophobia and visual disturbance.  Respiratory: Negative for cough, chest tightness, shortness of breath and wheezing.   Cardiovascular: Negative for chest pain and palpitations.  Gastrointestinal: Negative for abdominal pain, blood in stool, constipation, diarrhea,  nausea and vomiting.  Genitourinary: Positive for vaginal discharge. Negative for dysuria, hematuria and urgency.  Musculoskeletal: Negative for myalgias.  Skin: Negative for rash.  Neurological: Negative for dizziness, weakness and light-headedness.     Physical Exam Updated Vital Signs BP (!) 142/82 (BP Location: Right Arm)   Pulse 75   Temp 98.6 F (37 C) (Oral)   Resp 18   Ht 5\' 7"  (1.702 m)   Wt 110.2 kg   LMP 05/03/2019 (Exact Date)   SpO2 99%   BMI 38.06 kg/m   Physical Exam Vitals signs and nursing note reviewed. Exam conducted with a chaperone present.  Constitutional:      General: She is not in acute distress.    Appearance: She is well-developed.  HENT:     Head: Normocephalic and atraumatic.     Nose: Nose normal.  Eyes:     General: No scleral icterus.       Left eye: No discharge.     Conjunctiva/sclera: Conjunctivae normal.  Neck:     Musculoskeletal: Normal range of motion and neck supple.  Cardiovascular:     Rate and Rhythm: Normal rate and regular rhythm.     Heart sounds: Normal heart sounds. No murmur. No friction rub. No gallop.   Pulmonary:     Effort: Pulmonary effort is normal. No respiratory distress.     Breath sounds: Normal breath sounds.  Abdominal:     General: Bowel sounds are normal. There is no distension.     Palpations: Abdomen is soft.     Tenderness: There is no abdominal tenderness. There is no guarding.  Genitourinary:    Vagina: Vaginal discharge present.     Cervix: No cervical motion tenderness.     Adnexa:        Right: No tenderness.         Left: No tenderness.       Comments: Pelvic exam: normal external genitalia without evidence of trauma. VULVA: normal appearing vulva with no masses, tenderness or lesion. VAGINA: normal appearing vagina with normal color and scant thin discharge. No lesions. CERVIX: normal appearing cervix without lesions, cervical motion tenderness absent, cervical os closed with out purulent  discharge; Wet prep and DNA probe for chlamydia and GC obtained.   ADNEXA: normal adnexa in size, nontender and  no masses UTERUS: uterus is normal size, shape, consistency and nontender.   Musculoskeletal: Normal range of motion.  Skin:    General: Skin is warm and dry.     Findings: No rash.  Neurological:     Mental Status: She is alert.     Motor: No abnormal muscle tone.     Coordination: Coordination normal.      ED Treatments / Results  Labs (all labs ordered are listed, but only abnormal results are displayed) Labs Reviewed  WET PREP, GENITAL - Abnormal; Notable for the following components:      Result Value   Clue Cells Wet Prep HPF POC PRESENT (*)    WBC, Wet Prep HPF POC MANY (*)    All other components within normal limits  GC/CHLAMYDIA PROBE AMP [facility]    EKG None  Radiology No results found.  Procedures Procedures (including critical care time)  Medications Ordered in ED Medications - No data to display   Initial Impression / Assessment and Plan / ED Course  I have reviewed the triage vital signs and the nursing notes.  Pertinent labs & imaging results that were available during my care of the patient were reviewed by me and considered in my medical decision making (see chart for details).  Clinical Course as of Jul 24 1952  Thu Jul 25, 2019  1950 Spoke to Dr. Debroah LoopArnold, OB/GYN on call.  States that patient should be treated for BV and low suspicion for endometritis or other complication due to her physical exam findings and vital signs.   [HK]    Clinical Course User Index [HK] Dietrich PatesKhatri, Jenisha Faison, PA-C       [age] year old female presents to ED for vaginal discharge and odor.  She is status post D&C on 07/17/2019.  States that her bleeding has gradually improved but she is still having abdominal cramping which is "mild" since her procedure.  Physical exam findings show scant vaginal discharge without cervical motion tenderness, adnexal  tenderness or uterine tenderness.  Her vital signs are within normal limits, she is afebrile with no history of fever.  Wet prep positive for clue cells.  Per OB/GYN consult, she should be treated for same, no imaging necessary.  Her abdomen is soft, nontender nondistended.  Will treat with Flagyl and have her follow-up with OB/GYN.  Patient is hemodynamically stable, in NAD, and able to ambulate in the ED. Evaluation does not show pathology that would require ongoing emergent intervention or inpatient treatment. I explained the diagnosis to the patient. Pain has been managed and has no complaints prior to discharge. Patient is comfortable with above plan and is stable for discharge at this time. All questions were answered prior to disposition. Strict return precautions for returning to the ED were discussed. Encouraged follow up with PCP.   An After Visit Summary was printed and given to the patient.   Portions of this note were generated with Scientist, clinical (histocompatibility and immunogenetics)Dragon dictation software. Dictation errors may occur despite best attempts at proofreading.   Final Clinical Impressions(s) / ED Diagnoses   Final diagnoses:  BV (bacterial vaginosis)    ED Discharge Orders         Ordered    metroNIDAZOLE (FLAGYL) 500 MG tablet  2 times daily     07/25/19 1951           Dietrich PatesKhatri, Mark Benecke, [place]PA-C 07/25/19 1953    Wynetta FinesMessick, Peter C, MD 07/29/19 1027

## 2019-07-26 LAB — GC/CHLAMYDIA PROBE AMP (~~LOC~~) NOT AT ARMC
Chlamydia: NEGATIVE
Neisseria Gonorrhea: NEGATIVE

## 2020-03-01 ENCOUNTER — Other Ambulatory Visit: Payer: Self-pay

## 2020-03-01 ENCOUNTER — Emergency Department (HOSPITAL_COMMUNITY)
Admission: EM | Admit: 2020-03-01 | Discharge: 2020-03-01 | Disposition: A | Discharge status: Home / Self Care | Payer: Medicaid Other | Attending: Emergency Medicine | Admitting: Emergency Medicine

## 2020-03-01 ENCOUNTER — Encounter (HOSPITAL_COMMUNITY): Payer: Self-pay

## 2020-03-01 DIAGNOSIS — N72 Inflammatory disease of cervix uteri: Secondary | ICD-10-CM | POA: Diagnosis not present

## 2020-03-01 DIAGNOSIS — F17210 Nicotine dependence, cigarettes, uncomplicated: Secondary | ICD-10-CM | POA: Diagnosis not present

## 2020-03-01 DIAGNOSIS — N898 Other specified noninflammatory disorders of vagina: Secondary | ICD-10-CM | POA: Diagnosis present

## 2020-03-01 LAB — COMPREHENSIVE METABOLIC PANEL
ALT: 18 U/L (ref 0–44)
AST: 20 U/L (ref 15–41)
Albumin: 3.7 g/dL (ref 3.5–5.0)
Alkaline Phosphatase: 60 U/L (ref 38–126)
Anion gap: 9 (ref 5–15)
BUN: 7 mg/dL (ref 6–20)
CO2: 24 mmol/L (ref 22–32)
Calcium: 9 mg/dL (ref 8.9–10.3)
Chloride: 108 mmol/L (ref 98–111)
Creatinine, Ser: 1.03 mg/dL — ABNORMAL HIGH (ref 0.44–1.00)
GFR calc Af Amer: >60 mL/min (ref >60)
GFR calc non Af Amer: >60 mL/min (ref >60)
Glucose, Bld: 98 mg/dL (ref 70–99)
Potassium: 3.9 mmol/L (ref 3.5–5.1)
Sodium: 141 mmol/L (ref 135–145)
Total Bilirubin: 0.4 mg/dL (ref 0.3–1.2)
Total Protein: 7.3 g/dL (ref 6.5–8.1)

## 2020-03-01 LAB — URINALYSIS, ROUTINE W REFLEX MICROSCOPIC
Bilirubin Urine: NEGATIVE
Glucose, UA: NEGATIVE mg/dL
Hgb urine dipstick: NEGATIVE
Ketones, ur: NEGATIVE mg/dL
Leukocytes,Ua: NEGATIVE
Nitrite: NEGATIVE
Protein, ur: 30 mg/dL — AB
Specific Gravity, Urine: 1.027 (ref 1.005–1.030)
pH: 6 (ref 5.0–8.0)

## 2020-03-01 LAB — WET PREP, GENITAL
Sperm: NONE SEEN
Trich, Wet Prep: NONE SEEN
Yeast Wet Prep HPF POC: NONE SEEN

## 2020-03-01 LAB — CBC
HCT: 38.1 % (ref 36.0–46.0)
Hemoglobin: 11.8 g/dL — ABNORMAL LOW (ref 12.0–15.0)
MCH: 26.2 pg (ref 26.0–34.0)
MCHC: 31 g/dL (ref 30.0–36.0)
MCV: 84.5 fL (ref 80.0–100.0)
Platelets: 225 10*3/uL (ref 150–400)
RBC: 4.51 MIL/uL (ref 3.87–5.11)
RDW: 16.3 % — ABNORMAL HIGH (ref 11.5–15.5)
WBC: 9.2 10*3/uL (ref 4.0–10.5)
nRBC: 0 % (ref 0.0–0.2)

## 2020-03-01 LAB — I-STAT BETA HCG BLOOD, ED (MC, WL, AP ONLY): I-stat hCG, quantitative: <5 m[IU]/mL (ref <5)

## 2020-03-01 LAB — LIPASE, BLOOD: Lipase: 20 U/L (ref 11–51)

## 2020-03-01 MED ORDER — LIDOCAINE HCL (PF) 1 % IJ SOLN
INTRAMUSCULAR | Status: AC
  Administered 2020-03-01: 1 mL
  Filled 2020-03-01: qty 5

## 2020-03-01 MED ORDER — DOXYCYCLINE MONOHYDRATE 100 MG PO CAPS: 100.0000 mg | ORAL_CAPSULE | Freq: Two times a day (BID) | ORAL | 0 refills | Status: DC

## 2020-03-01 MED ORDER — SODIUM CHLORIDE 0.9% FLUSH: 3.0000 mL | Freq: Once | INTRAVENOUS | Status: DC

## 2020-03-01 MED ORDER — CEFTRIAXONE SODIUM 500 MG IJ SOLR
500.0000 mg | Freq: Once | INTRAMUSCULAR | Status: AC
  Administered 2020-03-01: 500 mg via INTRAMUSCULAR
  Filled 2020-03-01: qty 500

## 2020-03-01 MED ORDER — METRONIDAZOLE 500 MG PO TABS
500.0000 mg | ORAL_TABLET | Freq: Two times a day (BID) | ORAL | 0 refills | Status: AC
Start: 2020-03-01 — End: 2020-03-08

## 2020-03-01 NOTE — ED Triage Notes (Signed)
Patient complains of lower abdominal pain since am. Complains of discharge with odor, started an old antibiotic yesterday

## 2020-03-01 NOTE — Care Management (Deleted)
PT paged

## 2020-03-01 NOTE — Discharge Instructions (Signed)
Return if any problems.

## 2020-03-01 NOTE — ED Provider Notes (Signed)
[facility] EMERGENCY DEPARTMENT Provider Note   CSN: [id number] Arrival date & time: 03/01/20  1245     History No chief complaint on file.   Karen Frederick is a 24 y.o. female.  The history is provided by the patient. No language interpreter was used.  Vaginal Discharge Quality:  Malodorous Severity:  Moderate Onset quality:  Gradual Duration:  2 days Timing:  Constant Progression:  Worsening Chronicity:  New Context: not at rest   Relieved by:  Nothing Worsened by:  Nothing Ineffective treatments:  None tried Associated symptoms: abdominal pain   Associated symptoms: no fever   Pt unsure of std exposure possibility      Past Medical History:  Diagnosis Date  . Anemia    with pregnacy  . Dyspnea   . Gonorrhea   . Trichomonal vaginitis     Patient Active Problem List   Diagnosis Date Noted  . Vaginal delivery 02/14/2016  . Obesity 02/13/2016  . Anemia affecting pregnancy 02/13/2016  . Late prenatal care 02/13/2016    Past Surgical History:  Procedure Laterality Date  . DILATION AND EVACUATION N/A 07/17/2019   Procedure: DILATATION AND EVACUATION;  Surgeon: Osborn Coho, MD;  Location: [facility] OR;  Service: Gynecology;  Laterality: N/A;  . HERNIA REPAIR     umbilical- age 76     OB History    Gravida  4   Para  1   Term  1   Preterm      AB  2   Living  1     SAB  1   TAB  1   Ectopic      Multiple  0   Live Births  1           Family History  Problem Relation Age of Onset  . Diabetes Paternal Grandmother   . Cancer Paternal Grandmother        breast with mets  . Alcohol abuse Neg Hx   . Arthritis Neg Hx   . Asthma Neg Hx   . Birth defects Neg Hx   . COPD Neg Hx   . Depression Neg Hx   . Drug abuse Neg Hx   . Early death Neg Hx   . Hearing loss Neg Hx   . Heart disease Neg Hx   . Hyperlipidemia Neg Hx   . Hypertension Neg Hx   . Kidney disease Neg Hx   . Learning disabilities Neg Hx   . Mental  illness Neg Hx   . Mental retardation Neg Hx   . Miscarriages / Stillbirths Neg Hx   . Stroke Neg Hx   . Vision loss Neg Hx   . Varicose Veins Neg Hx     Social History   Tobacco Use  . Smoking status: Current Every Day Smoker    Packs/day: 0.25    Years: 8.00    Pack years: 2.00    Types: Cigarettes  . Smokeless tobacco: Never Used  . Tobacco comment: trying to quit  Vaping Use  . Vaping Use: Never used  Substance Use Topics  . Alcohol use: No  . Drug use: Not Currently    Types: Marijuana    Comment: last used 08/22/18    Home Medications Prior to Admission medications   Medication Sig Start Date End Date Taking? Authorizing Provider  doxycycline (MONODOX) 100 MG capsule Take 1 capsule (100 mg total) by mouth 2 (two) times daily. 03/01/20   Cheron Schaumann  K, PA-C  metroNIDAZOLE (FLAGYL) 500 MG tablet Take 1 tablet (500 mg total) by mouth 2 (two) times daily for 7 days. 03/01/20 03/08/20  Elson Areas, PA-C    Allergies    Patient has no known allergies.  Review of Systems   Review of Systems  Constitutional: Negative for fever.  Gastrointestinal: Positive for abdominal pain.  Genitourinary: Positive for vaginal discharge.  All other systems reviewed and are negative.   Physical Exam Updated Vital Signs BP 130/78   Pulse 87   Temp 98.8 F (37.1 C) (Oral)   Resp (!) 81   Ht 5\' 7"  (1.702 m)   Wt 99.8 kg   LMP 05/03/2019 (Exact Date)   SpO2 100%   BMI 34.46 kg/m   Physical Exam Constitutional:      Appearance: She is well-developed.  HENT:     Head: Normocephalic and atraumatic.  Eyes:     Conjunctiva/sclera: Conjunctivae normal.     Pupils: Pupils are equal, round, and reactive to light.  Cardiovascular:     Rate and Rhythm: Normal rate.  Abdominal:     Palpations: Abdomen is soft.     Tenderness: There is no abdominal tenderness.  Genitourinary:    Vagina: Vaginal discharge present.     Comments: Vaginal discharge,  Thick white,  Adnexa no  masses,  Cervix nontender Musculoskeletal:        General: Normal range of motion.     Cervical back: Normal range of motion and neck supple.  Skin:    General: Skin is warm.     ED Results / Procedures / Treatments   Labs (all labs ordered are listed, but only abnormal results are displayed) Labs Reviewed  WET PREP, GENITAL - Abnormal; Notable for the following components:      Result Value   Clue Cells Wet Prep HPF POC PRESENT (*)    WBC, Wet Prep HPF POC RARE (*)    All other components within normal limits  COMPREHENSIVE METABOLIC PANEL - Abnormal; Notable for the following components:   Creatinine, Ser 1.03 (*)    All other components within normal limits  CBC - Abnormal; Notable for the following components:   Hemoglobin 11.8 (*)    RDW 16.3 (*)    All other components within normal limits  URINALYSIS, ROUTINE W REFLEX MICROSCOPIC - Abnormal; Notable for the following components:   APPearance CLOUDY (*)    Protein, ur 30 (*)    Bacteria, UA RARE (*)    All other components within normal limits  LIPASE, BLOOD  I-STAT BETA HCG BLOOD, ED ([facility], [facility], [facility] ONLY)  GC/CHLAMYDIA PROBE AMP ([place]) NOT AT [facility]    EKG None  Radiology No results found.  Procedures Procedures (including critical care time)  Medications Ordered in ED Medications  sodium chloride flush (NS) 0.9 % injection 3 mL (has no administration in time range)  cefTRIAXone (ROCEPHIN) injection 500 mg (has no administration in time range)    ED Course  I have reviewed the triage vital signs and the nursing notes.  Pertinent labs & imaging results that were available during my care of the patient were reviewed by me and considered in my medical decision making (see chart for details).    MDM Rules/Calculators/A&P                          MDM:  Pt given injection of rocephin.  Rx for doxycycline and metronidazole  Final Clinical Impression(s) / ED Diagnoses Final diagnoses:  Cervicitis    Rx  / DC Orders ED Discharge Orders         Ordered    metroNIDAZOLE (FLAGYL) 500 MG tablet  2 times daily     Discontinue  Reprint     03/01/20 1722    doxycycline (MONODOX) 100 MG capsule  2 times daily     Discontinue  Reprint     03/01/20 1722        An After Visit Summary was printed and given to the patient.    Elson Areas, [place] 03/01/20 1744    Margarita Grizzle, MD 03/02/20 [phone]

## 2020-03-02 LAB — GC/CHLAMYDIA PROBE AMP (~~LOC~~) NOT AT ARMC
Chlamydia: NEGATIVE
Comment: NEGATIVE
Comment: NORMAL
Neisseria Gonorrhea: NEGATIVE

## 2020-04-27 ENCOUNTER — Ambulatory Visit (HOSPITAL_COMMUNITY)
Admission: EM | Admit: 2020-04-27 | Discharge: 2020-04-27 | Disposition: A | Discharge status: Home / Self Care | Payer: Medicaid Other | Attending: Family Medicine | Admitting: Family Medicine

## 2020-04-27 ENCOUNTER — Other Ambulatory Visit: Payer: Self-pay

## 2020-04-27 ENCOUNTER — Encounter (HOSPITAL_COMMUNITY): Payer: Self-pay

## 2020-04-27 DIAGNOSIS — R112 Nausea with vomiting, unspecified: Secondary | ICD-10-CM | POA: Diagnosis not present

## 2020-04-27 DIAGNOSIS — J069 Acute upper respiratory infection, unspecified: Secondary | ICD-10-CM | POA: Diagnosis not present

## 2020-04-27 DIAGNOSIS — U071 COVID-19: Secondary | ICD-10-CM | POA: Insufficient documentation

## 2020-04-27 DIAGNOSIS — F17210 Nicotine dependence, cigarettes, uncomplicated: Secondary | ICD-10-CM | POA: Diagnosis not present

## 2020-04-27 DIAGNOSIS — E669 Obesity, unspecified: Secondary | ICD-10-CM | POA: Insufficient documentation

## 2020-04-27 DIAGNOSIS — Z8759 Personal history of other complications of pregnancy, childbirth and the puerperium: Secondary | ICD-10-CM | POA: Diagnosis not present

## 2020-04-27 DIAGNOSIS — Z79899 Other long term (current) drug therapy: Secondary | ICD-10-CM | POA: Diagnosis not present

## 2020-04-27 DIAGNOSIS — D649 Anemia, unspecified: Secondary | ICD-10-CM | POA: Insufficient documentation

## 2020-04-27 MED ORDER — ONDANSETRON 4 MG PO TBDP
4.0000 mg | ORAL_TABLET | Freq: Three times a day (TID) | ORAL | 0 refills | Status: DC | PRN
Start: 2020-04-27 — End: 2021-08-24

## 2020-04-27 NOTE — ED Triage Notes (Signed)
Pt c/o sore throat, body aches, HA, productive cough w/white/yellow mucous, loss of taste and smellx5 days.

## 2020-04-27 NOTE — ED Provider Notes (Signed)
[facility]    CSN: [id number] Arrival date & time: 04/27/20  1429      History   Chief Complaint Chief Complaint  Patient presents with  . COVID sx    HPI Karen Frederick is a 24 y.o. female.   Presenting today with 5 days of N/V, cough, chills, fever, sweats, generalized body aches, fatigue. Denies CP, SOB, rashes. Several sick contacts recently, and son sick with a fever currently. Not taking anything OTC for sxs, tried aleve once without benefit.      Past Medical History:  Diagnosis Date  . Anemia    with pregnacy  . Dyspnea   . Gonorrhea   . Trichomonal vaginitis     Patient Active Problem List   Diagnosis Date Noted  . Vaginal delivery 02/14/2016  . Obesity 02/13/2016  . Anemia affecting pregnancy 02/13/2016  . Late prenatal care 02/13/2016    Past Surgical History:  Procedure Laterality Date  . DILATION AND EVACUATION N/A 07/17/2019   Procedure: DILATATION AND EVACUATION;  Surgeon: Osborn Coho, MD;  Location: [facility] OR;  Service: Gynecology;  Laterality: N/A;  . HERNIA REPAIR     umbilical- age 77    OB History    Gravida  4   Para  1   Term  1   Preterm      AB  2   Living  1     SAB  1   TAB  1   Ectopic      Multiple  0   Live Births  1            Home Medications    Prior to Admission medications   Medication Sig Start Date End Date Taking? Authorizing Provider  doxycycline (MONODOX) 100 MG capsule Take 1 capsule (100 mg total) by mouth 2 (two) times daily. 03/01/20   Elson Areas, PA-C  ondansetron (ZOFRAN ODT) 4 MG disintegrating tablet Take 1 tablet (4 mg total) by mouth every 8 (eight) hours as needed for nausea or vomiting. 04/27/20   Particia Nearing, PA-C    Family History Family History  Problem Relation Age of Onset  . Diabetes Paternal Grandmother   . Cancer Paternal Grandmother        breast with mets  . Alcohol abuse Neg Hx   . Arthritis Neg Hx   . Asthma Neg Hx   . Birth defects Neg  Hx   . COPD Neg Hx   . Depression Neg Hx   . Drug abuse Neg Hx   . Early death Neg Hx   . Hearing loss Neg Hx   . Heart disease Neg Hx   . Hyperlipidemia Neg Hx   . Hypertension Neg Hx   . Kidney disease Neg Hx   . Learning disabilities Neg Hx   . Mental illness Neg Hx   . Mental retardation Neg Hx   . Miscarriages / Stillbirths Neg Hx   . Stroke Neg Hx   . Vision loss Neg Hx   . Varicose Veins Neg Hx     Social History Social History   Tobacco Use  . Smoking status: Current Every Day Smoker    Packs/day: 0.25    Years: 8.00    Pack years: 2.00    Types: Cigarettes  . Smokeless tobacco: Never Used  . Tobacco comment: trying to quit  Vaping Use  . Vaping Use: Never used  Substance Use Topics  . Alcohol use: No  .  Drug use: Not Currently    Types: Marijuana    Comment: last used 08/22/18     Allergies   Patient has no known allergies.   Review of Systems Review of Systems Per HPI   Physical Exam Triage Vital Signs ED Triage Vitals  Enc Vitals Group     BP 04/27/20 1510 127/87     Pulse Rate 04/27/20 1510 78     Resp 04/27/20 1510 16     Temp 04/27/20 1510 98.9 F (37.2 C)     Temp Source 04/27/20 1510 Oral     SpO2 04/27/20 1510 100 %     Weight 04/27/20 1511 224 lb (101.6 kg)     Height 04/27/20 1511 5\' 7"  (1.702 m)     Head Circumference --      Peak Flow --      Pain Score 04/27/20 1510 5     Pain Loc --      Pain Edu? --      Excl. in GC? --    No data found.  Updated Vital Signs BP 127/87   Pulse 78   Temp 98.9 F (37.2 C) (Oral)   Resp 16   Ht 5\' 7"  (1.702 m)   Wt 224 lb (101.6 kg)   LMP 05/03/2019 (Exact Date)   SpO2 100%   BMI 35.08 kg/m   Visual Acuity Right Eye Distance:   Left Eye Distance:   Bilateral Distance:    Right Eye Near:   Left Eye Near:    Bilateral Near:     Physical Exam Vitals and nursing note reviewed.  Constitutional:      Appearance: Normal appearance. She is not ill-appearing.  HENT:     Head:  Atraumatic.     Right Ear: Tympanic membrane normal.     Left Ear: Tympanic membrane normal.     Nose: Congestion present.     Mouth/Throat:     Pharynx: Posterior oropharyngeal erythema present.  Eyes:     Extraocular Movements: Extraocular movements intact.     Conjunctiva/sclera: Conjunctivae normal.  Cardiovascular:     Rate and Rhythm: Normal rate and regular rhythm.     Heart sounds: Normal heart sounds.  Pulmonary:     Effort: Pulmonary effort is normal.     Breath sounds: Normal breath sounds. No wheezing or rales.  Abdominal:     General: Bowel sounds are normal. There is no distension.     Palpations: Abdomen is soft.     Tenderness: There is no abdominal tenderness. There is no right CVA tenderness, left CVA tenderness or guarding.  Musculoskeletal:        General: Normal range of motion.     Cervical back: Normal range of motion and neck supple.  Skin:    General: Skin is warm and dry.  Neurological:     Mental Status: She is alert and oriented to person, place, and time.  Psychiatric:        Mood and Affect: Mood normal.        Thought Content: Thought content normal.        Judgment: Judgment normal.     UC Treatments / Results  Labs (all labs ordered are listed, but only abnormal results are displayed) Labs Reviewed  SARS CORONAVIRUS 2 (TAT 6-24 HRS)    EKG   Radiology No results found.  Procedures Procedures (including critical care time)  Medications Ordered in UC Medications - No data to display  Initial  Impression / Assessment and Plan / UC Course  I have reviewed the triage vital signs and the nursing notes.  Pertinent labs & imaging results that were available during my care of the patient were reviewed by me and considered in my medical decision making (see chart for details).     Sxs suspicious for COVID 19, results pending. Isolation protocol reviewed at length, work note given. Zofran sent to help with her N/V and keep down fluids  and over the counter symptomatic care medications. Supportive care and return precautions reviewed. F/u if worsening or not improving.   Final Clinical Impressions(s) / UC Diagnoses   Final diagnoses:  Viral URI with cough  Non-intractable vomiting with nausea, unspecified vomiting type   Discharge Instructions   None    ED Prescriptions    Medication Sig Dispense Auth. Provider   ondansetron (ZOFRAN ODT) 4 MG disintegrating tablet Take 1 tablet (4 mg total) by mouth every 8 (eight) hours as needed for nausea or vomiting. 30 tablet Particia Nearing, [place]     PDMP not reviewed this encounter.   Particia Nearing, [place] 04/27/20 1617

## 2020-04-28 LAB — SARS CORONAVIRUS 2 (TAT 6-24 HRS): SARS Coronavirus 2: POSITIVE — AB

## 2020-04-29 ENCOUNTER — Telehealth: Payer: Self-pay | Admitting: Family

## 2020-04-29 NOTE — Telephone Encounter (Signed)
Called to Discuss with patient about Covid symptoms and the use of the monoclonal antibody infusion for those with mild to moderate Covid symptoms and at a high risk of hospitalization.     Pt appears to qualify for this infusion due to co-morbid conditions and/or a member of an at-risk group in accordance with the FDA Emergency Use Authorization.   Karen Frederick was recently seen at the [facility] on 9/6 with nausea, vomiting, cough, chills, fever, body aches and fatigue. COVID testing positive. Risk factors include BMI >25. Sx started 9/1. Continues to have symptoms currently. Information sent to MyChart per request.   Karen Eke, NP 04/29/2020 10:21 AM

## 2020-08-22 DIAGNOSIS — Z419 Encounter for procedure for purposes other than remedying health state, unspecified: Secondary | ICD-10-CM | POA: Diagnosis not present

## 2020-09-07 ENCOUNTER — Other Ambulatory Visit: Payer: Self-pay | Admitting: Obstetrics and Gynecology

## 2020-09-07 DIAGNOSIS — N870 Mild cervical dysplasia: Secondary | ICD-10-CM | POA: Diagnosis not present

## 2020-09-08 DIAGNOSIS — E559 Vitamin D deficiency, unspecified: Secondary | ICD-10-CM | POA: Diagnosis not present

## 2020-09-08 DIAGNOSIS — N760 Acute vaginitis: Secondary | ICD-10-CM | POA: Diagnosis not present

## 2020-09-08 DIAGNOSIS — R87619 Unspecified abnormal cytological findings in specimens from cervix uteri: Secondary | ICD-10-CM | POA: Diagnosis not present

## 2020-09-08 DIAGNOSIS — Z113 Encounter for screening for infections with a predominantly sexual mode of transmission: Secondary | ICD-10-CM | POA: Diagnosis not present

## 2020-09-08 DIAGNOSIS — F319 Bipolar disorder, unspecified: Secondary | ICD-10-CM | POA: Diagnosis not present

## 2020-09-22 DIAGNOSIS — Z419 Encounter for procedure for purposes other than remedying health state, unspecified: Secondary | ICD-10-CM | POA: Diagnosis not present

## 2020-10-05 DIAGNOSIS — N870 Mild cervical dysplasia: Secondary | ICD-10-CM | POA: Diagnosis not present

## 2020-10-20 DIAGNOSIS — Z419 Encounter for procedure for purposes other than remedying health state, unspecified: Secondary | ICD-10-CM | POA: Diagnosis not present

## 2020-11-07 ENCOUNTER — Other Ambulatory Visit: Payer: Self-pay

## 2020-11-07 ENCOUNTER — Encounter (HOSPITAL_COMMUNITY): Payer: Self-pay

## 2020-11-07 ENCOUNTER — Emergency Department (HOSPITAL_COMMUNITY)
Admission: EM | Admit: 2020-11-07 | Discharge: 2020-11-07 | Disposition: A | Discharge status: Home / Self Care | Payer: Medicaid Other | Attending: Emergency Medicine | Admitting: Emergency Medicine

## 2020-11-07 ENCOUNTER — Emergency Department (HOSPITAL_COMMUNITY): Payer: Medicaid Other

## 2020-11-07 DIAGNOSIS — M25512 Pain in left shoulder: Secondary | ICD-10-CM | POA: Diagnosis not present

## 2020-11-07 DIAGNOSIS — F17210 Nicotine dependence, cigarettes, uncomplicated: Secondary | ICD-10-CM | POA: Insufficient documentation

## 2020-11-07 DIAGNOSIS — S90121A Contusion of right lesser toe(s) without damage to nail, initial encounter: Secondary | ICD-10-CM | POA: Insufficient documentation

## 2020-11-07 DIAGNOSIS — S4992XA Unspecified injury of left shoulder and upper arm, initial encounter: Secondary | ICD-10-CM | POA: Diagnosis present

## 2020-11-07 DIAGNOSIS — S43402A Unspecified sprain of left shoulder joint, initial encounter: Secondary | ICD-10-CM | POA: Diagnosis not present
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
## 2020-11-07 NOTE — Progress Notes (Signed)
Orthopedic Tech Progress Note Patient Details:  Karen Frederick [birth year] [id number]  Ortho Devices Type of Ortho Device: Sling immobilizer Ortho Device/Splint Location: left Ortho Device/Splint Interventions: Application   Post Interventions Patient Tolerated: Well Instructions Provided: Care of device   Saul Fordyce 11/07/2020, 12:17 PM

## 2020-11-07 NOTE — Discharge Instructions (Addendum)
Your xray did not show any signs of fractures. Please use sling as needed for comfort. While at home please apply ice as needed. You can take 800 mg Ibuprofen every 8 hours as needed for pain and 1,000 mg Tylenol every 8 hours as needed for pain (alternate Ibuprofen and Tylenol every 4 hours).   If no improvement in symptoms please follow up with Dr. Ophelia Charter with ortho for further evaluation  Return to the ED for any worsening symptoms

## 2020-11-07 NOTE — ED Notes (Signed)
Ortho tech called for immobilizer 
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
## 2020-11-20 DIAGNOSIS — Z419 Encounter for procedure for purposes other than remedying health state, unspecified: Secondary | ICD-10-CM | POA: Diagnosis not present

## 2020-11-30 DIAGNOSIS — Z113 Encounter for screening for infections with a predominantly sexual mode of transmission: Secondary | ICD-10-CM | POA: Diagnosis not present

## 2020-11-30 DIAGNOSIS — N760 Acute vaginitis: Secondary | ICD-10-CM | POA: Diagnosis not present

## 2020-11-30 DIAGNOSIS — N941 Unspecified dyspareunia: Secondary | ICD-10-CM | POA: Diagnosis not present

## 2020-12-10 DIAGNOSIS — A549 Gonococcal infection, unspecified: Secondary | ICD-10-CM | POA: Diagnosis not present

## 2020-12-20 DIAGNOSIS — Z419 Encounter for procedure for purposes other than remedying health state, unspecified: Secondary | ICD-10-CM | POA: Diagnosis not present

## 2021-01-20 DIAGNOSIS — Z419 Encounter for procedure for purposes other than remedying health state, unspecified: Secondary | ICD-10-CM | POA: Diagnosis not present

## 2021-02-19 DIAGNOSIS — Z419 Encounter for procedure for purposes other than remedying health state, unspecified: Secondary | ICD-10-CM | POA: Diagnosis not present

## 2021-03-12 ENCOUNTER — Ambulatory Visit (HOSPITAL_COMMUNITY)
Admission: EM | Admit: 2021-03-12 | Discharge: 2021-03-12 | Disposition: A | Discharge status: Home / Self Care | Payer: 59

## 2021-03-12 ENCOUNTER — Other Ambulatory Visit: Payer: Self-pay

## 2021-03-12 DIAGNOSIS — F331 Major depressive disorder, recurrent, moderate: Secondary | ICD-10-CM
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
## 2021-03-12 NOTE — ED Notes (Signed)
Patient alert and oriented X 4, denies SI, HI and AVH. Patient received AVS with community resources.

## 2021-03-12 NOTE — Discharge Instructions (Addendum)
Be sure to follow up with resources and follow ups given for therapy.  In the event of worsening symptoms call the crisis hotline at 81, text [phone] for therapist, call 911, and or go to the nearest emergency department for appropriate evaluation and treatment of symptoms.  Follow-up with your primary care provider for your medical issues, concerns and or health care needs.

## 2021-03-22 DIAGNOSIS — Z419 Encounter for procedure for purposes other than remedying health state, unspecified: Secondary | ICD-10-CM | POA: Diagnosis not present

## 2021-04-12 ENCOUNTER — Emergency Department (HOSPITAL_BASED_OUTPATIENT_CLINIC_OR_DEPARTMENT_OTHER): Payer: 59

## 2021-04-12 ENCOUNTER — Ambulatory Visit (HOSPITAL_COMMUNITY)
Admission: EM | Admit: 2021-04-12 | Discharge: 2021-04-12 | Disposition: A | Discharge status: Home / Self Care | Payer: Medicaid Other | Attending: Sports Medicine | Admitting: Sports Medicine

## 2021-04-12 ENCOUNTER — Encounter (HOSPITAL_COMMUNITY): Payer: Self-pay

## 2021-04-12 ENCOUNTER — Encounter (HOSPITAL_BASED_OUTPATIENT_CLINIC_OR_DEPARTMENT_OTHER): Payer: Self-pay

## 2021-04-12 ENCOUNTER — Other Ambulatory Visit: Payer: Self-pay

## 2021-04-12 ENCOUNTER — Emergency Department (HOSPITAL_BASED_OUTPATIENT_CLINIC_OR_DEPARTMENT_OTHER)
Admission: EM | Admit: 2021-04-12 | Discharge: 2021-04-12 | Disposition: A | Discharge status: Home / Self Care | Payer: 59 | Attending: Emergency Medicine | Admitting: Emergency Medicine

## 2021-04-12 DIAGNOSIS — F17210 Nicotine dependence, cigarettes, uncomplicated: Secondary | ICD-10-CM | POA: Diagnosis not present

## 2021-04-12 DIAGNOSIS — H532 Diplopia: Secondary | ICD-10-CM

## 2021-04-12 DIAGNOSIS — H5789 Other specified disorders of eye and adnexa: Secondary | ICD-10-CM

## 2021-04-12 DIAGNOSIS — S0990XA Unspecified injury of head, initial encounter: Secondary | ICD-10-CM | POA: Diagnosis present

## 2021-04-12 DIAGNOSIS — S060X0A Concussion without loss of consciousness, initial encounter: Secondary | ICD-10-CM | POA: Diagnosis not present

## 2021-04-12 DIAGNOSIS — G44319 Acute post-traumatic headache, not intractable: Secondary | ICD-10-CM

## 2021-04-12 MED ORDER — METOCLOPRAMIDE HCL 5 MG/ML IJ SOLN
10.0000 mg | Freq: Once | INTRAMUSCULAR | Status: AC
  Administered 2021-04-12: 10 mg via INTRAVENOUS
  Filled 2021-04-12: qty 2

## 2021-04-12 MED ORDER — DIPHENHYDRAMINE HCL 50 MG/ML IJ SOLN
25.0000 mg | Freq: Once | INTRAMUSCULAR | Status: AC
  Administered 2021-04-12: 25 mg via INTRAVENOUS
  Filled 2021-04-12: qty 1

## 2021-04-12 MED ORDER — FLUORESCEIN SODIUM 1 MG OP STRP
1.0000 | ORAL_STRIP | Freq: Once | OPHTHALMIC | Status: AC
  Administered 2021-04-12: 1 via OPHTHALMIC
  Filled 2021-04-12: qty 1

## 2021-04-12 MED ORDER — LACTATED RINGERS IV BOLUS
1000.0000 mL | Freq: Once | INTRAVENOUS | Status: AC
  Administered 2021-04-12: 1000 mL via INTRAVENOUS

## 2021-04-12 MED ORDER — TETRACAINE HCL 0.5 % OP SOLN
1.0000 [drp] | Freq: Once | OPHTHALMIC | Status: AC
  Administered 2021-04-12: 1 [drp] via OPHTHALMIC
  Filled 2021-04-12: qty 4
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
## 2021-04-22 DIAGNOSIS — Z419 Encounter for procedure for purposes other than remedying health state, unspecified: Secondary | ICD-10-CM | POA: Diagnosis not present

## 2021-05-22 DIAGNOSIS — Z419 Encounter for procedure for purposes other than remedying health state, unspecified: Secondary | ICD-10-CM | POA: Diagnosis not present

## 2021-06-22 DIAGNOSIS — Z419 Encounter for procedure for purposes other than remedying health state, unspecified: Secondary | ICD-10-CM | POA: Diagnosis not present

## 2021-07-22 DIAGNOSIS — Z419 Encounter for procedure for purposes other than remedying health state, unspecified: Secondary | ICD-10-CM | POA: Diagnosis not present

## 2021-08-22 DIAGNOSIS — Z419 Encounter for procedure for purposes other than remedying health state, unspecified: Secondary | ICD-10-CM | POA: Diagnosis not present

## 2021-08-24 ENCOUNTER — Inpatient Hospital Stay (HOSPITAL_COMMUNITY)
Admission: AD | Admit: 2021-08-24 | Discharge: 2021-08-24 | Disposition: A | Discharge status: Home / Self Care | Payer: Medicaid Other | Attending: Obstetrics & Gynecology | Admitting: Obstetrics & Gynecology

## 2021-08-24 ENCOUNTER — Other Ambulatory Visit: Payer: Self-pay

## 2021-08-24 ENCOUNTER — Encounter (HOSPITAL_COMMUNITY): Payer: Self-pay | Admitting: Obstetrics & Gynecology

## 2021-08-24 ENCOUNTER — Inpatient Hospital Stay (HOSPITAL_COMMUNITY): Payer: Medicaid Other

## 2021-08-24 DIAGNOSIS — O26851 Spotting complicating pregnancy, first trimester: Secondary | ICD-10-CM | POA: Insufficient documentation

## 2021-08-24 DIAGNOSIS — Z3A01 Less than 8 weeks gestation of pregnancy: Secondary | ICD-10-CM | POA: Diagnosis not present

## 2021-08-24 DIAGNOSIS — O3680X0 Pregnancy with inconclusive fetal viability, not applicable or unspecified: Secondary | ICD-10-CM

## 2021-08-24 DIAGNOSIS — R109 Unspecified abdominal pain: Secondary | ICD-10-CM | POA: Insufficient documentation

## 2021-08-24 DIAGNOSIS — O26891 Other specified pregnancy related conditions, first trimester: Secondary | ICD-10-CM | POA: Diagnosis not present

## 2021-08-24 HISTORY — DX: Unspecified abnormal cytological findings in specimens from vagina: R87.629

## 2021-08-24 HISTORY — DX: Bipolar disorder, unspecified: F31.9

## 2021-08-24 HISTORY — DX: Depression, unspecified: F32.A

## 2021-08-24 LAB — CBC
HCT: 37 % (ref 36.0–46.0)
Hemoglobin: 12 g/dL (ref 12.0–15.0)
MCH: 28 pg (ref 26.0–34.0)
MCHC: 32.4 g/dL (ref 30.0–36.0)
MCV: 86.4 fL (ref 80.0–100.0)
Platelets: 240 10*3/uL (ref 150–400)
RBC: 4.28 MIL/uL (ref 3.87–5.11)
RDW: 15.9 % — ABNORMAL HIGH (ref 11.5–15.5)
WBC: 8.6 10*3/uL (ref 4.0–10.5)
nRBC: 0 % (ref 0.0–0.2)

## 2021-08-24 LAB — URINALYSIS, ROUTINE W REFLEX MICROSCOPIC
Bilirubin Urine: NEGATIVE
Glucose, UA: NEGATIVE mg/dL
Hgb urine dipstick: NEGATIVE
Ketones, ur: NEGATIVE mg/dL
Leukocytes,Ua: NEGATIVE
Nitrite: NEGATIVE
Protein, ur: NEGATIVE mg/dL
Specific Gravity, Urine: 1.02 (ref 1.005–1.030)
pH: 7 (ref 5.0–8.0)

## 2021-08-24 LAB — WET PREP, GENITAL
Sperm: NONE SEEN
Trich, Wet Prep: NONE SEEN
WBC, Wet Prep HPF POC: >=10 — AB (ref <10)
Yeast Wet Prep HPF POC: NONE SEEN

## 2021-08-24 LAB — HCG, QUANTITATIVE, PREGNANCY: hCG, Beta Chain, Quant, S: 3871 m[IU]/mL — ABNORMAL HIGH (ref <5)

## 2021-08-24 LAB — POCT PREGNANCY, URINE: Preg Test, Ur: POSITIVE — AB

## 2021-08-24 NOTE — MAU Provider Note (Signed)
History     CSN: [id number]  Arrival date and time: 08/24/21 1440   Event Date/Time   First Provider Initiated Contact with Patient 08/24/21 1805      Chief Complaint  Patient presents with   Vaginal Bleeding   Abdominal Pain   Possible Pregnancy   HPI  Karen Frederick is a 26 y.o. [id number] at [redacted]w[redacted]d by certain LMP who presents to MAU with chief complaints of abdominal pain and vaginal spotting. These are new problems, onset this morning. She describes her pain as "like my period is coming on but never does". Her bleeding is scant and visualized while wiping on two different occasions. She denies heavy vaginal bleeding, sharp abdominal pain, dysuria, fever or recent illness. She is remote from sexual intercourse.  OB History     Gravida  4   Para  1   Term  1   Preterm      AB  2   Living  1      SAB  2   IAB  0   Ectopic      Multiple  0   Live Births  1           Past Medical History:  Diagnosis Date   Anemia    with pregnacy   Bipolar depression ([place])    dx age 9   Depression    Dyspnea    Gonorrhea    Trichomonal vaginitis    Vaginal Pap smear, abnormal     Past Surgical History:  Procedure Laterality Date   DILATION AND EVACUATION N/A 07/17/2019   Procedure: DILATATION AND EVACUATION;  Surgeon: Everett Graff, MD;  Location: [facility];  Service: Gynecology;  Laterality: N/A;   HERNIA REPAIR     umbilical- age 68    Family History  Problem Relation Age of Onset   Healthy Mother    Healthy Father    Diabetes Paternal Grandmother    Cancer Paternal Grandmother        breast with mets   Alcohol abuse Neg Hx    Arthritis Neg Hx    Asthma Neg Hx    Birth defects Neg Hx    COPD Neg Hx    Depression Neg Hx    Drug abuse Neg Hx    Early death Neg Hx    Hearing loss Neg Hx    Heart disease Neg Hx    Hyperlipidemia Neg Hx    Hypertension Neg Hx    Kidney disease Neg Hx    Learning disabilities Neg Hx    Mental illness Neg Hx    Mental  retardation Neg Hx    Miscarriages / Stillbirths Neg Hx    Stroke Neg Hx    Vision loss Neg Hx    Varicose Veins Neg Hx     Social History   Tobacco Use   Smoking status: Some Days    Packs/day: 0.25    Years: 10.00    Pack years: 2.50    Types: Cigarettes   Smokeless tobacco: Never   Tobacco comments:    trying to quit  Vaping Use   Vaping Use: Every day  Substance Use Topics   Alcohol use: Not Currently   Drug use: Not Currently    Types: Marijuana    Comment: last used 08/22/18    Allergies: No Known Allergies  Medications Prior to Admission  Medication Sig Dispense Refill Last Dose   doxycycline (MONODOX) 100 MG capsule Take 1  capsule (100 mg total) by mouth 2 (two) times daily. 14 capsule 0    ondansetron (ZOFRAN ODT) 4 MG disintegrating tablet Take 1 tablet (4 mg total) by mouth every 8 (eight) hours as needed for nausea or vomiting. 30 tablet 0     Review of Systems  Gastrointestinal:  Positive for abdominal pain.  Genitourinary:  Positive for vaginal bleeding.  All other systems reviewed and are negative. Physical Exam   Blood pressure 133/71, pulse 77, temperature 98.6 F (37 C), temperature source Oral, resp. rate 16, height 5\' 7"  (1.702 m), weight 99.5 kg, last menstrual period 07/23/2021, SpO2 100 %.  Physical Exam Vitals and nursing note reviewed. Exam conducted with a chaperone present.  Constitutional:      Appearance: She is well-developed.  Cardiovascular:     Rate and Rhythm: Normal rate.  Pulmonary:     Effort: Pulmonary effort is normal.     Breath sounds: Normal breath sounds.  Abdominal:     General: Abdomen is flat.     Palpations: Abdomen is soft.     Tenderness: There is no abdominal tenderness. There is no right CVA tenderness or left CVA tenderness.  Skin:    Capillary Refill: Capillary refill takes less than 2 seconds.  Neurological:     Mental Status: She is alert and oriented to person, place, and time.  Psychiatric:         Mood and Affect: Mood normal.        Behavior: Behavior normal.    MAU Course  Procedures  Orders Placed This Encounter  Procedures   Wet prep, genital   US OB LESS THAN 14 WEEKS WITH OB TRANSVAGINAL   CBC   hCG, quantitative, pregnancy   Urinalysis, Routine w reflex microscopic Urine, Clean Catch   Pregnancy, urine POC   Discharge patient   Patient Vitals for the past 24 hrs:  BP Temp Temp src Pulse Resp SpO2 Height Weight  08/24/21 1544 133/71 98.6 F (37 C) Oral 77 16 100 % 5\' 7"  (1.702 m) 99.5 kg   Results for orders placed or performed during the hospital encounter of 08/24/21 (from the past 24 hour(s))  Urinalysis, Routine w reflex microscopic Urine, Clean Catch     Status: None   Collection Time: 08/24/21  3:00 PM  Result Value Ref Range   Color, Urine YELLOW YELLOW   APPearance CLEAR CLEAR   Specific Gravity, Urine 1.020 1.005 - 1.030   pH 7.0 5.0 - 8.0   Glucose, UA NEGATIVE NEGATIVE mg/dL   Hgb urine dipstick NEGATIVE NEGATIVE   Bilirubin Urine NEGATIVE NEGATIVE   Ketones, ur NEGATIVE NEGATIVE mg/dL   Protein, ur NEGATIVE NEGATIVE mg/dL   Nitrite NEGATIVE NEGATIVE   Leukocytes,Ua NEGATIVE NEGATIVE  Pregnancy, urine POC     Status: Abnormal   Collection Time: 08/24/21  3:01 PM  Result Value Ref Range   Preg Test, Ur POSITIVE (A) NEGATIVE  CBC     Status: Abnormal   Collection Time: 08/24/21  3:30 PM  Result Value Ref Range   WBC 8.6 4.0 - 10.5 K/uL   RBC 4.28 3.87 - 5.11 MIL/uL   Hemoglobin 12.0 12.0 - 15.0 g/dL   HCT 37.0 36.0 - 46.0 %   MCV 86.4 80.0 - 100.0 fL   MCH 28.0 26.0 - 34.0 pg   MCHC 32.4 30.0 - 36.0 g/dL   RDW 15.9 (H) 11.5 - 15.5 %   Platelets 240 150 - 400 K/uL   nRBC  0.0 0.0 - 0.2 %  hCG, quantitative, pregnancy     Status: Abnormal   Collection Time: 08/24/21  3:30 PM  Result Value Ref Range   hCG, Beta Chain, Quant, S 3,871 (H) <5 mIU/mL  Wet prep, genital     Status: Abnormal   Collection Time: 08/24/21  3:55 PM   Specimen:  Vaginal  Result Value Ref Range   Yeast Wet Prep HPF POC NONE SEEN NONE SEEN   Trich, Wet Prep NONE SEEN NONE SEEN   Clue Cells Wet Prep HPF POC PRESENT (A) NONE SEEN   WBC, Wet Prep HPF POC >=10 (A) <10   Sperm NONE SEEN    US OB LESS THAN 14 WEEKS WITH OB TRANSVAGINAL  Result Date: 08/24/2021 CLINICAL DATA:  Spotting and cramping. EXAM: OBSTETRIC <14 WK [place] AND TRANSVAGINAL OB US TECHNIQUE: Both transabdominal and transvaginal ultrasound examinations were performed for complete evaluation of the gestation as well as the maternal uterus, adnexal regions, and pelvic cul-de-sac. Transvaginal technique was performed to assess early pregnancy. COMPARISON:  None. FINDINGS: Intrauterine gestational sac: Single Yolk sac:  Not Visualized. Embryo:  Not Visualized. MSD: 5.6 mm   5 w   2 d Maternal uterus/adnexae: Bilateral ovaries appear within normal limits. Right ovary measures 3.2 x 1.7 x 3.2 cm. Left ovary measures 2.5 x 1.5 x 1.4 cm. There is a moderate amount of free fluid in the pelvis. IMPRESSION: 1. Single sac-like structure in the endometrium corresponding to gestational age of [redacted] weeks and 2 days. No fetal pole or yolk sac identified to confirm. 2. Ovaries within normal limits.  No adnexal masses. 3. Moderate free fluid in the pelvis. In the setting of a positive pregnancy test, findings may be related to early normal IUP, occult ectopic pregnancy, or abortion in progress. Recommend close interval follow-up. Electronically Signed   By: Ronney Asters M.D.   On: 08/24/2021 17:55    Assessment and Plan  --26 y.o. [id number] with PUL --+GS, no YS or fetal pole --Stat Quant hCG in 48 hours --Discharge home in stable condition with ectopic precautions  F/U: --Quant hCG collected after 2pm. Will schedule for stat quant in office Friday morning  Mallie Snooks, [place], MSN, CNM Certified Nurse Midwife, Product/process development scientist for [employer], [place]

## 2021-08-24 NOTE — Discharge Instructions (Signed)

## 2021-08-24 NOTE — MAU Note (Signed)
Believes she is preg, 4+HPT, first was on Sat.  Woke up today with cramping, more on the right.  Having some bleeding, "like her cycle is wanting to come on". Seeing when she has wiped x2.

## 2021-08-25 LAB — GC/CHLAMYDIA PROBE AMP (~~LOC~~) NOT AT ARMC
Chlamydia: NEGATIVE
Comment: NEGATIVE
Comment: NORMAL
Neisseria Gonorrhea: NEGATIVE

## 2021-08-27 ENCOUNTER — Ambulatory Visit: Payer: Self-pay

## 2021-08-30 DIAGNOSIS — O3680X9 Pregnancy with inconclusive fetal viability, other fetus: Secondary | ICD-10-CM | POA: Diagnosis not present

## 2021-09-01 DIAGNOSIS — O3680X9 Pregnancy with inconclusive fetal viability, other fetus: Secondary | ICD-10-CM | POA: Diagnosis not present

## 2021-09-02 DIAGNOSIS — Z3A00 Weeks of gestation of pregnancy not specified: Secondary | ICD-10-CM | POA: Diagnosis not present

## 2021-09-02 DIAGNOSIS — O3680X9 Pregnancy with inconclusive fetal viability, other fetus: Secondary | ICD-10-CM | POA: Diagnosis not present

## 2021-09-02 DIAGNOSIS — R112 Nausea with vomiting, unspecified: Secondary | ICD-10-CM | POA: Diagnosis not present

## 2021-09-16 DIAGNOSIS — A545 Gonococcal pharyngitis: Secondary | ICD-10-CM | POA: Diagnosis not present

## 2021-09-22 DIAGNOSIS — Z419 Encounter for procedure for purposes other than remedying health state, unspecified: Secondary | ICD-10-CM | POA: Diagnosis not present

## 2021-09-23 ENCOUNTER — Encounter (HOSPITAL_COMMUNITY): Payer: Self-pay | Admitting: Radiology

## 2021-09-24 ENCOUNTER — Other Ambulatory Visit: Payer: Self-pay

## 2021-09-24 ENCOUNTER — Encounter (HOSPITAL_COMMUNITY): Payer: Self-pay | Admitting: Obstetrics & Gynecology

## 2021-09-24 ENCOUNTER — Other Ambulatory Visit: Payer: Self-pay | Admitting: Obstetrics & Gynecology

## 2021-09-24 DIAGNOSIS — R102 Pelvic and perineal pain: Secondary | ICD-10-CM | POA: Diagnosis not present

## 2021-09-24 NOTE — Progress Notes (Signed)
DUE TO COVID-19 ONLY ONE VISITOR IS ALLOWED TO COME WITH YOU AND STAY IN THE WAITING ROOM ONLY DURING PRE OP AND PROCEDURE DAY OF SURGERY.   PCP - Dr Osborn Coho Cardiologist - n/a  Chest x-ray - n/a EKG - n/a Stress Test - n/a ECHO - n/a Cardiac Cath - n/a  ICD Pacemaker/Loop - n/a  Sleep Study -  n/a CPAP - none  STOP now taking any Aspirin (unless otherwise instructed by your surgeon), Aleve, Naproxen, Ibuprofen, Motrin, Advil, Goody's, BC's, all herbal medications, fish oil, and all vitamins.   Coronavirus Screening Covid test n/a Ambulatory Surgery Do you have any of the following symptoms:  Cough Yes, No Fever (>100.57F)  yes/no: No Runny nose yes/no: No Sore throat yes/no: No Difficulty breathing/shortness of breath  No   Have you traveled in the last 14 days and where? yes/no: No   Patient verbalized understanding of instructions that were given via phone.

## 2021-09-27 ENCOUNTER — Encounter (HOSPITAL_COMMUNITY): Payer: Self-pay | Admitting: Obstetrics & Gynecology

## 2021-09-27 ENCOUNTER — Encounter (HOSPITAL_COMMUNITY)
Admission: RE | Disposition: A | Discharge status: Home / Self Care | Payer: Self-pay | Source: Ambulatory Visit | Attending: Obstetrics & Gynecology

## 2021-09-27 ENCOUNTER — Ambulatory Visit (HOSPITAL_COMMUNITY): Payer: Medicaid Other | Admitting: Anesthesiology

## 2021-09-27 ENCOUNTER — Ambulatory Visit (HOSPITAL_COMMUNITY)
Admission: RE | Admit: 2021-09-27 | Discharge: 2021-09-27 | Disposition: A | Discharge status: Home / Self Care | Payer: Medicaid Other | Source: Ambulatory Visit | Attending: Obstetrics & Gynecology | Admitting: Obstetrics & Gynecology

## 2021-09-27 ENCOUNTER — Other Ambulatory Visit: Payer: Self-pay

## 2021-09-27 DIAGNOSIS — O034 Incomplete spontaneous abortion without complication: Secondary | ICD-10-CM | POA: Diagnosis not present

## 2021-09-27 DIAGNOSIS — F17210 Nicotine dependence, cigarettes, uncomplicated: Secondary | ICD-10-CM | POA: Insufficient documentation

## 2021-09-27 DIAGNOSIS — O021 Missed abortion: Secondary | ICD-10-CM | POA: Diagnosis not present

## 2021-09-27 DIAGNOSIS — Z3A00 Weeks of gestation of pregnancy not specified: Secondary | ICD-10-CM | POA: Diagnosis not present

## 2021-09-27 HISTORY — DX: Myoneural disorder, unspecified: G70.9

## 2021-09-27 HISTORY — PX: DILATION AND EVACUATION: SHX1459

## 2021-09-27 LAB — TYPE AND SCREEN
ABO/RH(D): O POS
Antibody Screen: NEGATIVE

## 2021-09-27 LAB — CBC
HCT: 37.3 % (ref 36.0–46.0)
Hemoglobin: 12.1 g/dL (ref 12.0–15.0)
MCH: 28.5 pg (ref 26.0–34.0)
MCHC: 32.4 g/dL (ref 30.0–36.0)
MCV: 87.8 fL (ref 80.0–100.0)
Platelets: 251 10*3/uL (ref 150–400)
RBC: 4.25 MIL/uL (ref 3.87–5.11)
RDW: 14.9 % (ref 11.5–15.5)
WBC: 5.6 10*3/uL (ref 4.0–10.5)
nRBC: 0 % (ref 0.0–0.2)

## 2021-09-27 SURGERY — DILATION AND EVACUATION, UTERUS
Anesthesia: General

## 2021-09-27 MED ORDER — SILVER NITRATE-POT NITRATE 75-25 % EX MISC
CUTANEOUS | Status: AC
  Filled 2021-09-27: qty 10

## 2021-09-27 MED ORDER — ONDANSETRON HCL 4 MG/2ML IJ SOLN
INTRAMUSCULAR | Status: DC | PRN
Start: 2021-09-27 — End: 2021-09-27
  Administered 2021-09-27: 4 mg via INTRAVENOUS

## 2021-09-27 MED ORDER — AMISULPRIDE (ANTIEMETIC) 5 MG/2ML IV SOLN: 10.0000 mg | Freq: Once | INTRAVENOUS | Status: DC | PRN

## 2021-09-27 MED ORDER — LACTATED RINGERS IV SOLN: INTRAVENOUS | Status: DC

## 2021-09-27 MED ORDER — DEXAMETHASONE SODIUM PHOSPHATE 10 MG/ML IJ SOLN
INTRAMUSCULAR | Status: AC
  Filled 2021-09-27: qty 1

## 2021-09-27 MED ORDER — POVIDONE-IODINE 10 % EX SWAB
2.0000 "application " | Freq: Once | CUTANEOUS | Status: AC
  Administered 2021-09-27: 2 via TOPICAL

## 2021-09-27 MED ORDER — CHLORHEXIDINE GLUCONATE 0.12 % MT SOLN
OROMUCOSAL | Status: AC
  Administered 2021-09-27: 15 mL
  Filled 2021-09-27: qty 15

## 2021-09-27 MED ORDER — MIDAZOLAM HCL 2 MG/2ML IJ SOLN
INTRAMUSCULAR | Status: DC | PRN
Start: 2021-09-27 — End: 2021-09-27
  Administered 2021-09-27: 2 mg via INTRAVENOUS

## 2021-09-27 MED ORDER — AMISULPRIDE (ANTIEMETIC) 5 MG/2ML IV SOLN
INTRAVENOUS | Status: AC
  Filled 2021-09-27: qty 2

## 2021-09-27 MED ORDER — MIDAZOLAM HCL 2 MG/2ML IJ SOLN
INTRAMUSCULAR | Status: AC
  Filled 2021-09-27: qty 2

## 2021-09-27 MED ORDER — LIDOCAINE 2% (20 MG/ML) 5 ML SYRINGE
INTRAMUSCULAR | Status: DC | PRN
Start: 2021-09-27 — End: 2021-09-27
  Administered 2021-09-27: 80 mg via INTRAVENOUS

## 2021-09-27 MED ORDER — OXYCODONE HCL 5 MG PO TABS: 5.0000 mg | ORAL_TABLET | Freq: Once | ORAL | Status: DC | PRN

## 2021-09-27 MED ORDER — FENTANYL CITRATE (PF) 250 MCG/5ML IJ SOLN
INTRAMUSCULAR | Status: DC | PRN
  Administered 2021-09-27 (×5): 25 ug via INTRAVENOUS

## 2021-09-27 MED ORDER — SCOPOLAMINE 1 MG/3DAYS TD PT72
1.0000 | MEDICATED_PATCH | TRANSDERMAL | Status: DC
  Administered 2021-09-27: 1.5 mg via TRANSDERMAL
  Filled 2021-09-27: qty 1

## 2021-09-27 MED ORDER — OXYCODONE HCL 5 MG/5ML PO SOLN: 5.0000 mg | Freq: Once | ORAL | Status: DC | PRN

## 2021-09-27 MED ORDER — LIDOCAINE 2% (20 MG/ML) 5 ML SYRINGE
INTRAMUSCULAR | Status: AC
  Filled 2021-09-27: qty 5

## 2021-09-27 MED ORDER — OXYCODONE HCL 5 MG PO TABS
5.0000 mg | ORAL_TABLET | ORAL | Status: DC | PRN
  Administered 2021-09-27: 10 mg via ORAL

## 2021-09-27 MED ORDER — METHYLERGONOVINE MALEATE 0.2 MG PO TABS: 0.2000 mg | ORAL_TABLET | Freq: Four times a day (QID) | ORAL | 0 refills | Status: AC

## 2021-09-27 MED ORDER — LIDOCAINE HCL (PF) 1 % IJ SOLN
INTRAMUSCULAR | Status: AC
  Filled 2021-09-27: qty 30

## 2021-09-27 MED ORDER — OXYTOCIN 10 UNIT/ML IJ SOLN
INTRAMUSCULAR | Status: DC | PRN
  Administered 2021-09-27: 20 [IU] via INTRAMUSCULAR

## 2021-09-27 MED ORDER — DEXAMETHASONE SODIUM PHOSPHATE 10 MG/ML IJ SOLN
INTRAMUSCULAR | Status: DC | PRN
  Administered 2021-09-27: 10 mg via INTRAVENOUS

## 2021-09-27 MED ORDER — FENTANYL CITRATE (PF) 100 MCG/2ML IJ SOLN
INTRAMUSCULAR | Status: AC
  Filled 2021-09-27: qty 2

## 2021-09-27 MED ORDER — HYDROCODONE-ACETAMINOPHEN 5-325 MG PO TABS: 1.0000 | ORAL_TABLET | Freq: Four times a day (QID) | ORAL | 0 refills | Status: DC | PRN

## 2021-09-27 MED ORDER — LIDOCAINE HCL 1 % IJ SOLN
INTRAMUSCULAR | Status: DC | PRN
  Administered 2021-09-27: 11 mL

## 2021-09-27 MED ORDER — PROMETHAZINE HCL 25 MG/ML IJ SOLN
INTRAMUSCULAR | Status: AC
  Filled 2021-09-27: qty 1

## 2021-09-27 MED ORDER — ACETAMINOPHEN 500 MG PO TABS
1000.0000 mg | ORAL_TABLET | Freq: Once | ORAL | Status: AC
  Administered 2021-09-27: 1000 mg via ORAL
  Filled 2021-09-27: qty 2

## 2021-09-27 MED ORDER — KETOROLAC TROMETHAMINE 30 MG/ML IJ SOLN
INTRAMUSCULAR | Status: AC
  Filled 2021-09-27: qty 1

## 2021-09-27 MED ORDER — PROMETHAZINE HCL 25 MG/ML IJ SOLN
6.2500 mg | INTRAMUSCULAR | Status: DC | PRN
  Administered 2021-09-27: 6.25 mg via INTRAVENOUS

## 2021-09-27 MED ORDER — OXYCODONE HCL 5 MG PO TABS
ORAL_TABLET | ORAL | Status: AC
  Filled 2021-09-27: qty 2

## 2021-09-27 MED ORDER — FENTANYL CITRATE (PF) 250 MCG/5ML IJ SOLN
INTRAMUSCULAR | Status: AC
  Filled 2021-09-27: qty 5

## 2021-09-27 MED ORDER — SODIUM CHLORIDE 0.9 % IV SOLN
100.0000 mg | Freq: Once | INTRAVENOUS | Status: AC
  Administered 2021-09-27: 100 mg via INTRAVENOUS
  Filled 2021-09-27: qty 100

## 2021-09-27 MED ORDER — PROPOFOL 10 MG/ML IV BOLUS
INTRAVENOUS | Status: DC | PRN
Start: 2021-09-27 — End: 2021-09-27
  Administered 2021-09-27: 200 mg via INTRAVENOUS

## 2021-09-27 MED ORDER — DEXMEDETOMIDINE (PRECEDEX) IN NS 20 MCG/5ML (4 MCG/ML) IV SYRINGE
PREFILLED_SYRINGE | INTRAVENOUS | Status: DC | PRN
  Administered 2021-09-27: 8 ug via INTRAVENOUS

## 2021-09-27 MED ORDER — KETOROLAC TROMETHAMINE 30 MG/ML IJ SOLN
INTRAMUSCULAR | Status: DC | PRN
Start: 2021-09-27 — End: 2021-09-27
  Administered 2021-09-27: 30 mg via INTRAVENOUS

## 2021-09-27 MED ORDER — FENTANYL CITRATE (PF) 100 MCG/2ML IJ SOLN
25.0000 ug | INTRAMUSCULAR | Status: DC | PRN
  Administered 2021-09-27: 50 ug via INTRAVENOUS

## 2021-09-27 MED ORDER — ONDANSETRON HCL 4 MG/2ML IJ SOLN
INTRAMUSCULAR | Status: AC
  Filled 2021-09-27: qty 2

## 2021-09-27 SURGICAL SUPPLY — 15 items
FILTER UTR ASPR ASSEMBLY (MISCELLANEOUS) ×2 IMPLANT
GLOVE SURG ENC MOIS LTX SZ6.5 (GLOVE) ×2 IMPLANT
GLOVE SURG UNDER POLY LF SZ7 (GLOVE) ×4 IMPLANT
GOWN STRL REUS W/ TWL LRG LVL3 (GOWN DISPOSABLE) ×2 IMPLANT
GOWN STRL REUS W/TWL LRG LVL3 (GOWN DISPOSABLE) ×4
HOSE CONNECTING 18IN BERKELEY (TUBING) ×2 IMPLANT
KIT BERKELEY 1ST TRI 3/8 NO TR (MISCELLANEOUS) ×2 IMPLANT
KIT BERKELEY 1ST TRIMESTER 3/8 (MISCELLANEOUS) ×2 IMPLANT
NS IRRIG 1000ML POUR BTL (IV SOLUTION) ×2 IMPLANT
PACK VAGINAL MINOR WOMEN LF (CUSTOM PROCEDURE TRAY) ×2 IMPLANT
PAD OB MATERNITY 4.3X12.25 (PERSONAL CARE ITEMS) ×2 IMPLANT
SET BERKELEY SUCTION TUBING (SUCTIONS) ×2 IMPLANT
TOWEL GREEN STERILE FF (TOWEL DISPOSABLE) ×3 IMPLANT
UNDERPAD 30X36 HEAVY ABSORB (UNDERPADS AND DIAPERS) ×2 IMPLANT
VACURETTE 9 RIGID CVD (CANNULA) ×1 IMPLANT

## 2021-09-27 NOTE — Transfer of Care (Signed)
Immediate Anesthesia Transfer of Care Note  Patient: Karen Frederick  Procedure(s) Performed: DILATATION AND EVACUATION  Patient Location: PACU  Anesthesia Type:General  Level of Consciousness: drowsy  Airway & Oxygen Therapy: Patient Spontanous Breathing and Patient connected to face mask oxygen  Post-op Assessment: Report given to RN and Post -op Vital signs reviewed and stable  Post vital signs: Reviewed and stable  Last Vitals:  Vitals Value Taken Time  BP 101/59 09/27/21 1324  Temp    Pulse 53 09/27/21 1325  Resp 14 09/27/21 1325  SpO2 100 % 09/27/21 1325  Vitals shown include unvalidated device data.  Last Pain:  Vitals:   09/27/21 0956  TempSrc:   PainSc: 0-No pain         Complications: No notable events documented.

## 2021-09-27 NOTE — Anesthesia Postprocedure Evaluation (Signed)
Anesthesia Post Note  Patient: Karen Frederick  Procedure(s) Performed: DILATATION AND EVACUATION     Patient location during evaluation: PACU Anesthesia Type: General Level of consciousness: awake Pain management: pain level controlled Vital Signs Assessment: post-procedure vital signs reviewed and stable Respiratory status: spontaneous breathing and respiratory function stable Cardiovascular status: stable Postop Assessment: no apparent nausea or vomiting Anesthetic complications: no   No notable events documented.  Last Vitals:  Vitals:   09/27/21 1357 09/27/21 1407  BP: 125/82   Pulse: 70 65  Resp: 15 18  Temp:    SpO2: 100% 100%    Last Pain:  Vitals:   09/27/21 1407  TempSrc:   PainSc: Asleep                 Mellody Dance

## 2021-09-27 NOTE — Anesthesia Procedure Notes (Signed)
Procedure Name: LMA Insertion Date/Time: 09/27/2021 12:51 PM Performed by: Katina Degree, CRNA Pre-anesthesia Checklist: Patient identified, Emergency Drugs available, Suction available and Patient being monitored Patient Re-evaluated:Patient Re-evaluated prior to induction Oxygen Delivery Method: Circle system utilized Preoxygenation: Pre-oxygenation with 100% oxygen Induction Type: IV induction Ventilation: Mask ventilation without difficulty LMA: LMA inserted LMA Size: 5.0 Laser Tube: Cuffed inflated with minimal occlusive pressure - saline Placement Confirmation: positive ETCO2 and breath sounds checked- equal and bilateral Tube secured with: Tape Dental Injury: Teeth and Oropharynx as per pre-operative assessment

## 2021-09-27 NOTE — Anesthesia Preprocedure Evaluation (Addendum)
Anesthesia Evaluation  Patient identified by MRN, date of birth, ID band Patient awake    Reviewed: Allergy & Precautions, NPO status , Patient's Chart, lab work & pertinent test results  Airway Mallampati: II  TM Distance: >3 FB Neck ROM: Full    Dental no notable dental hx.    Pulmonary neg pulmonary ROS, Current Smoker and Patient abstained from smoking.,    Pulmonary exam normal breath sounds clear to auscultation       Cardiovascular negative cardio ROS Normal cardiovascular exam Rhythm:Regular Rate:Normal     Neuro/Psych PSYCHIATRIC DISORDERS Depression Bipolar Disorder negative neurological ROS     GI/Hepatic negative GI ROS, Neg liver ROS,   Endo/Other  negative endocrine ROS  Renal/GU negative Renal ROS  negative genitourinary   Musculoskeletal negative musculoskeletal ROS (+)   Abdominal   Peds negative pediatric ROS (+)  Hematology negative hematology ROS (+)   Anesthesia Other Findings   Reproductive/Obstetrics negative OB ROS                             Anesthesia Physical Anesthesia Plan  ASA: 2  Anesthesia Plan: General   Post-op Pain Management:    Induction: Intravenous  PONV Risk Score and Plan: Ondansetron, Dexamethasone, Midazolam, Scopolamine patch - Pre-op and Treatment may vary due to age or medical condition  Airway Management Planned: LMA  Additional Equipment:   Intra-op Plan:   Post-operative Plan: Extubation in OR  Informed Consent: I have reviewed the patients History and Physical, chart, labs and discussed the procedure including the risks, benefits and alternatives for the proposed anesthesia with the patient or authorized representative who has indicated his/her understanding and acceptance.     Dental advisory given  Plan Discussed with: CRNA, Anesthesiologist and Surgeon  Anesthesia Plan Comments:         Anesthesia Quick  Evaluation

## 2021-09-27 NOTE — H&P (Signed)
MD GYN HISTORY AND PHYSICAL  Admission Date: 09/27/2021  8:44 AM  Admit Diagnosis: incomplete ab Patient Name: Karen Frederick        MRN#: [id number]  Subjective:    Patient is a 26 y.o. female [phone] presents for scheduled D&E.  The indications for procedure are incomplete ab.   Pertinent Gynecological History: Menses:regular every month without intermenstrual spotting  Bleeding: none Contraception: none DES exposure: denies Blood transfusions: none Sexually transmitted diseases: h/o Trich Previous GYN Procedures:  none   Last mammogram:  n/a   Last pap: normal Date: 2021 OB History: G4, P1   Menstrual History: Menarche age: 63  Medical / Surgical History: Past medical history:  Past Medical History:  Diagnosis Date   Anemia    with pregnacy   Bipolar depression ([place])    dx age 67   Depression    Dyspnea    history, no current problem   Gonorrhea    Neuromuscular disorder ([place])    patient denies this dx on 09/24/21   Trichomonal vaginitis    Vaginal Pap smear, abnormal     Past surgical history:  Past Surgical History:  Procedure Laterality Date   DILATION AND EVACUATION N/A 07/17/2019   Procedure: DILATATION AND EVACUATION;  Surgeon: Everett Graff, MD;  Location: [facility];  Service: Gynecology;  Laterality: N/A;   HERNIA REPAIR     umbilical- age 18   Family History:  Family History  Problem Relation Age of Onset   Healthy Mother    Healthy Father    Diabetes Paternal Grandmother    Cancer Paternal Grandmother        breast with mets   Alcohol abuse Neg Hx    Arthritis Neg Hx    Asthma Neg Hx    Birth defects Neg Hx    COPD Neg Hx    Depression Neg Hx    Drug abuse Neg Hx    Early death Neg Hx    Hearing loss Neg Hx    Heart disease Neg Hx    Hyperlipidemia Neg Hx    Hypertension Neg Hx    Kidney disease Neg Hx    Learning disabilities Neg Hx    Mental illness Neg Hx    Mental retardation Neg Hx    Miscarriages / Stillbirths Neg Hx    Stroke Neg  Hx    Vision loss Neg Hx    Varicose Veins Neg Hx     Social History:  reports that she has been smoking cigarettes. She has a 2.50 pack-year smoking history. She has never used smokeless tobacco. She reports that she does not currently use alcohol. She reports that she does not currently use drugs after having used the following drugs: Marijuana.  Allergies: No Known Allergies   Current Medications at time of admission:  Prior to Admission medications   Medication Sig Start Date End Date Taking? Authorizing Provider  promethazine (PHENERGAN) 25 MG tablet Take 25 mg by mouth every 6 (six) hours as needed for nausea/vomiting. 09/02/21  Yes [provider]  doxycycline (VIBRA-TABS) 100 MG tablet Take 100 mg by mouth 2 (two) times daily. 7 day supply Patient not taking: Reported on 09/27/2021 09/24/21   [provider]  ondansetron (ZOFRAN-ODT) 4 MG disintegrating tablet Take 4 mg by mouth every 6 (six) hours as needed for nausea/vomiting. Patient not taking: Reported on 09/27/2021 09/24/21   [provider]    Review of Systems: Constitutional: Negative   HENT: Negative  Eyes: Negative   Respiratory: Negative   Cardiovascular: Negative   Gastrointestinal: Negative  Genitourinary: neg for vaginal bleeding   Musculoskeletal: Negative   Skin: Negative   Neurological: Negative   Endo/Heme/Allergies: Negative   Psychiatric/Behavioral: Negative      Objective:     Physical Exam: VS: Blood pressure 139/66, pulse 75, temperature 97.9 F (36.6 C), temperature source Oral, resp. rate 17, height 5\' 7"  (1.702 m), weight 97.1 kg, last menstrual period 07/23/2021, SpO2 100 %. Physical Exam General:   alert, cooperative, and appears stated age  Skin:   normal  Lungs:   clear to auscultation bilaterally  Heart:   regular rate and rhythm, S1, S2 normal, no murmur, click, rub or gallop  Abdomen:  soft, non-tender; bowel sounds normal; no masses,  no organomegaly  Pelvis:   Exam deferred.  Uterus: 10 cm   Labs / Imaging: Results for orders placed or performed during the hospital encounter of 09/27/21 (from the past 24 hour(s))  Type and screen     Status: None   Collection Time: 09/27/21  9:50 AM  Result Value Ref Range   ABO/RH(D) O POS    Antibody Screen NEG    Sample Expiration      09/30/2021,2359 Performed at [facility] [address] [place], [place] [address]    No results found.    Assessment:        Patient is a 26 y.o. y.o [phone] with diagnosis of  incomplete ab        Plan:    I had a lengthy discussion with the patient regarding her diagnosis. She was counseled about the procedure, risks, reasons, benefits and complications to include: injury to bowel, bladder, major blood vessel, bleeding, possibility of transfusion, infection, abnormal scar formation ain the uterus called Asherman's syndrome and further surgical treatment. All the above was reviewed in detail.    All inquiries made by patient were answered.  Consent was signed, witnessed and placed into chart. Post op Instructions were reviewed, including office follow up.      Sanjuana Kava MD 09/27/2021, 11:10 AM

## 2021-09-27 NOTE — Op Note (Signed)
Operative Report  Karen Frederick  DOB:    1996/03/11  MRN:    [id number]  Date of Surgery:  09/27/2021  Preoperative Diagnosis: Incomplete Abortion  Postoperative Diagnosis: same  Procedure: DILATATION AND EVACUATION  Surgeon: Delila Spence. Duayne Brideau MD  Assistant: None  Anesthetic: Choice  Estimated Blood Loss:   100 mL         Drains:  none         Total IV Fluids: 1000 ml  Blood Given: none          Specimens: products of conception              Complications:  * No complications entered in OR log *         Disposition: PACU - hemodynamically stable.         Condition: good    Indication: 9 week missed abortion as documented on office ultrasound. Patient understands the indications for surgical procedure.  She also understands the alternative treatment options. She accepts the risk of, but not limited to, anesthetic complications, bleeding, infections, and possible damage to the surrounding organs.               Technique:  Patient was placed in dorsal lithotomy position in [employer]. After adequate anesthesia was achieved, the patient was prepped and draped in the usual sterile fashion. The operative Graves speculum was placed in the vagina and the cervix stabilized with a single-tooth tenaculum.  A paracervical block using 1% lidocaine was performed. The cervix was dilated with Shawnie Pons dilators and the 9 mm curette was used to remove contents of the uterus.  Alternating sharp curettage with a curette and suction curettage was performed until all contents were removed and good crie was achieved.  All instruments were removed from the vagina.  The patient tolerated the procedure well.    Disposition: PACU - hemodynamically stable.         Condition: stable  [employer]

## 2021-09-28 ENCOUNTER — Encounter (HOSPITAL_COMMUNITY): Payer: Self-pay | Admitting: Obstetrics & Gynecology

## 2021-09-28 DIAGNOSIS — Z3A09 9 weeks gestation of pregnancy: Secondary | ICD-10-CM | POA: Diagnosis not present

## 2021-09-28 DIAGNOSIS — O039 Complete or unspecified spontaneous abortion without complication: Secondary | ICD-10-CM | POA: Diagnosis not present

## 2021-09-28 LAB — SURGICAL PATHOLOGY

## 2021-10-20 DIAGNOSIS — Z419 Encounter for procedure for purposes other than remedying health state, unspecified: Secondary | ICD-10-CM | POA: Diagnosis not present

## 2021-11-20 DIAGNOSIS — Z419 Encounter for procedure for purposes other than remedying health state, unspecified: Secondary | ICD-10-CM | POA: Diagnosis not present

## 2021-12-20 DIAGNOSIS — Z419 Encounter for procedure for purposes other than remedying health state, unspecified: Secondary | ICD-10-CM | POA: Diagnosis not present

## 2022-01-20 DIAGNOSIS — Z419 Encounter for procedure for purposes other than remedying health state, unspecified: Secondary | ICD-10-CM | POA: Diagnosis not present

## 2022-02-15 IMAGING — US US OB < 14 WEEKS - US OB TV
1 series · 15 of 28 positions shown · non-contrast
Comparison: None.

CLINICAL DATA: Spotting and cramping.

EXAM:
OBSTETRIC <14 WK US AND TRANSVAGINAL OB US
TECHNIQUE: Both transabdominal and transvaginal ultrasound examinations were
performed for complete evaluation of the gestation as well as the
maternal uterus, adnexal regions, and pelvic cul-de-sac.
Transvaginal technique was performed to assess early pregnancy.

[Series 1: us ob < 14 weeks - us ob tv · 15 of 46 slices shown]
[im 1/46]
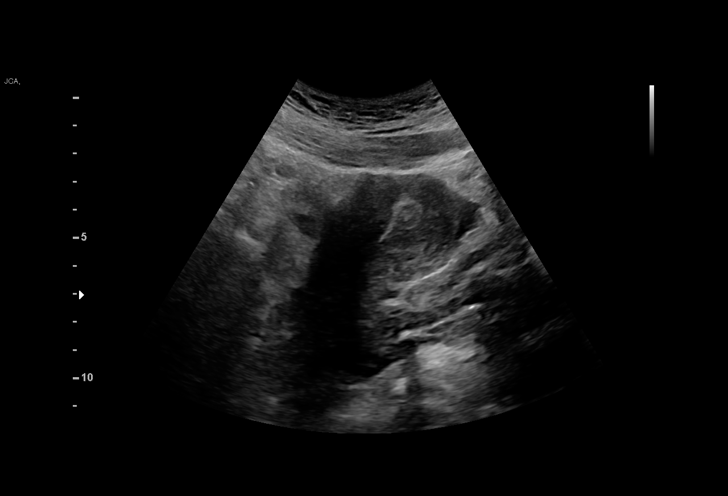
[im 4/46]
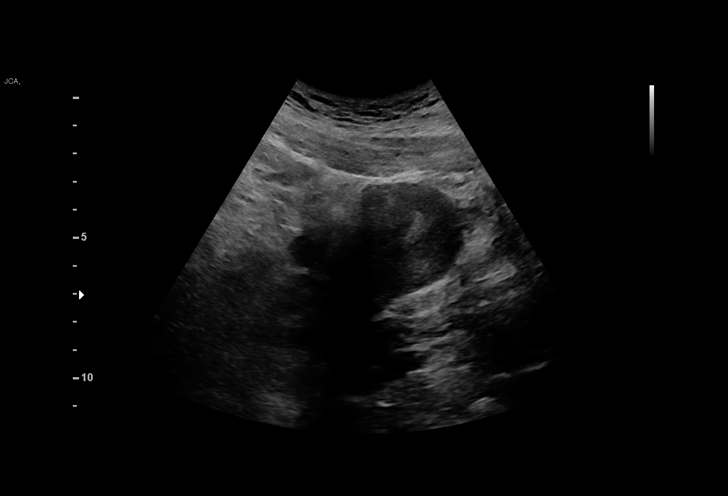
[im 7/46]
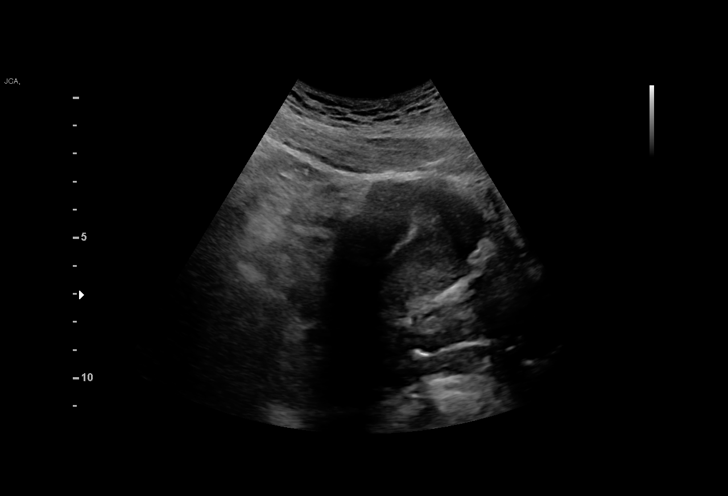
[im 11/46]
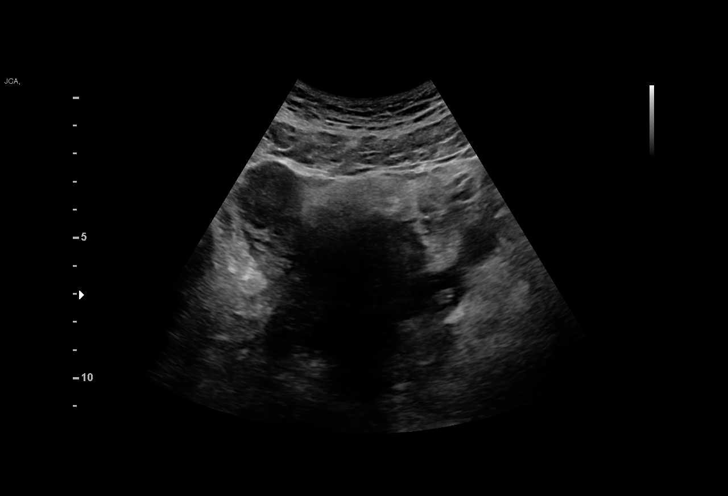
[im 14/46]
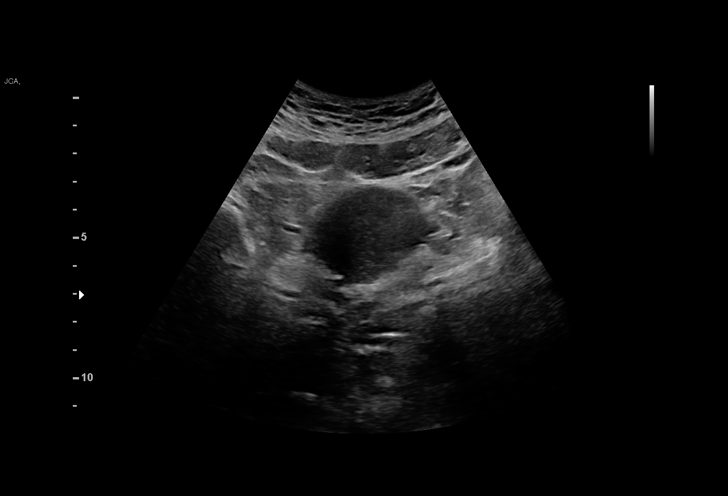
[im 17/46]
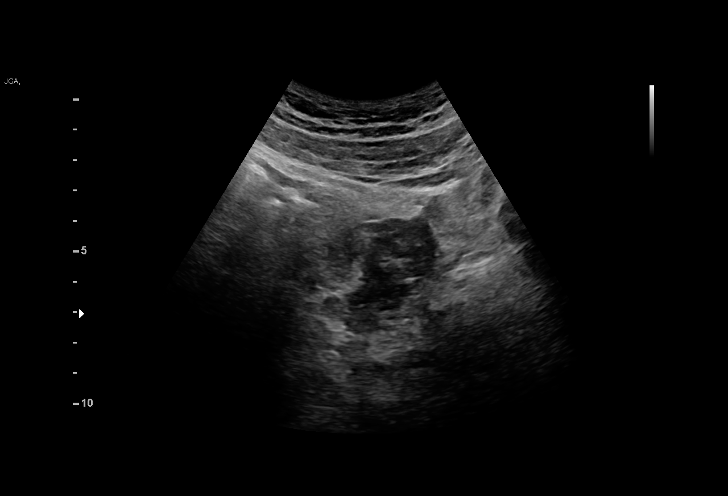
[im 21/46]
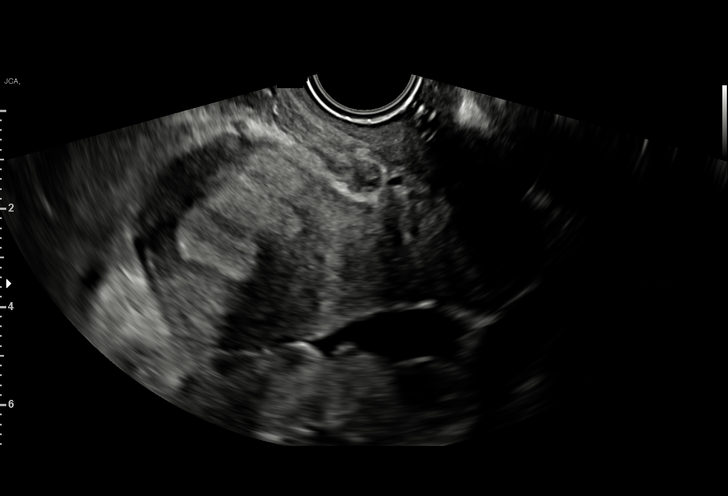
[im 24/46]
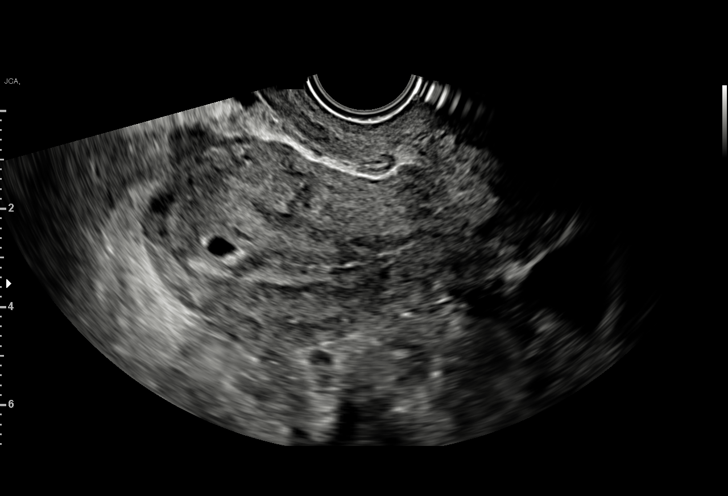
[im 26/46]
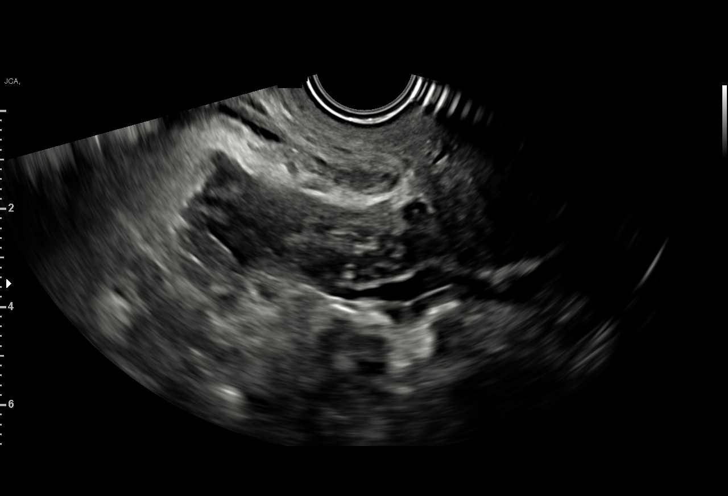
[im 29/46]
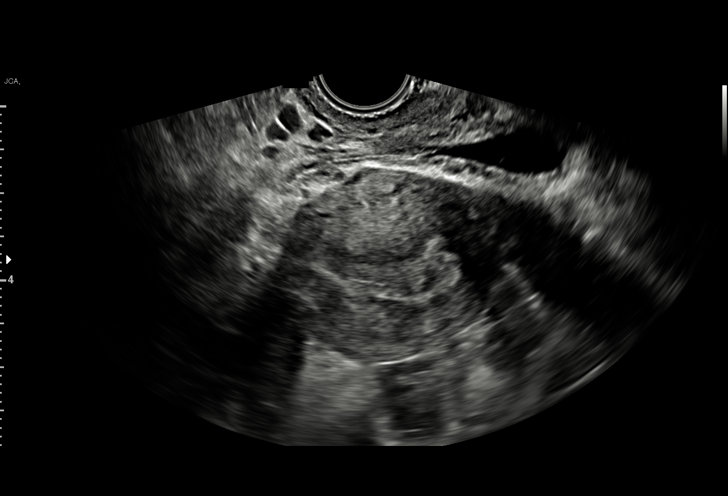
[im 32/46]
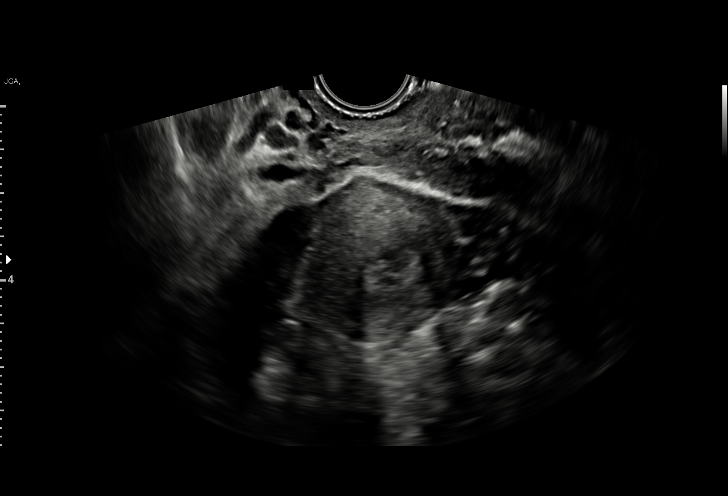
[im 36/46]
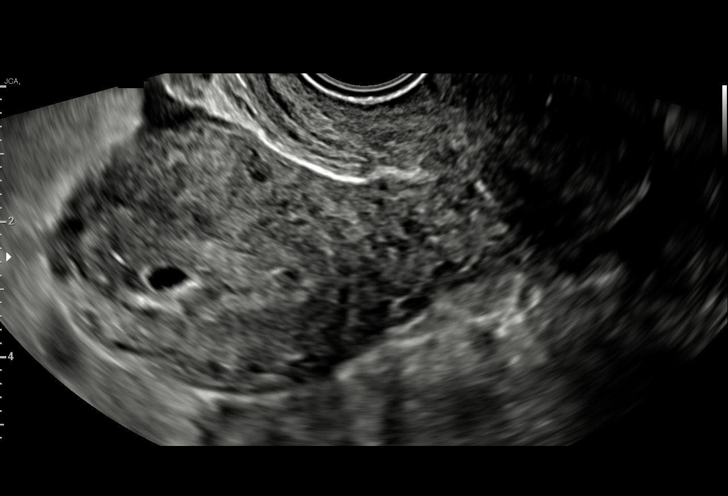
[im 39/46]
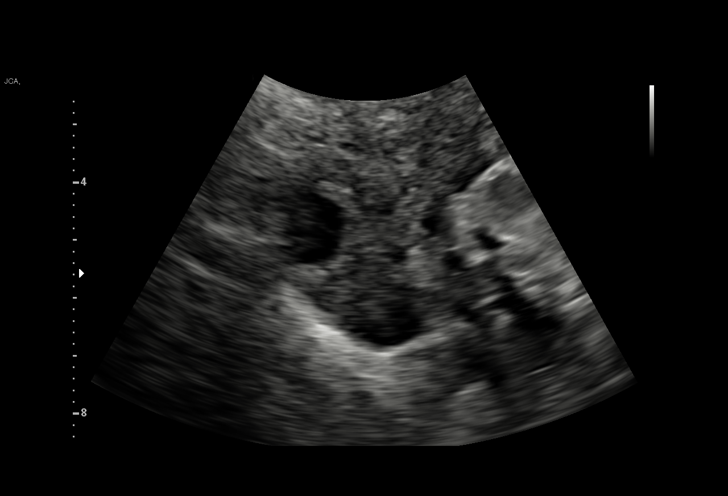
[im 42/46]
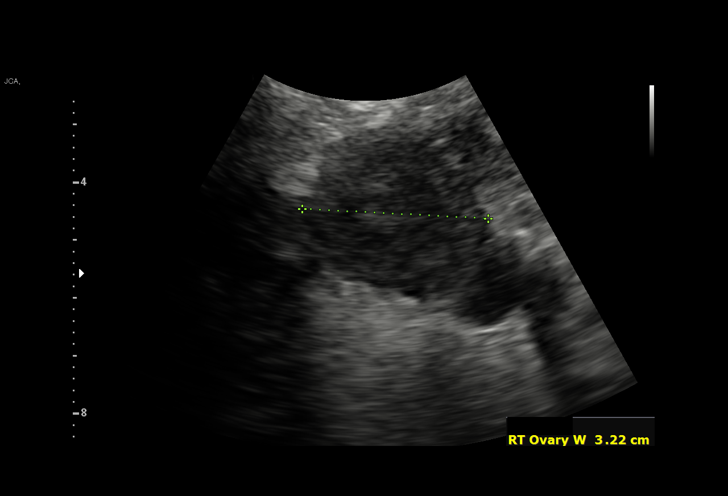
[im 46/46]
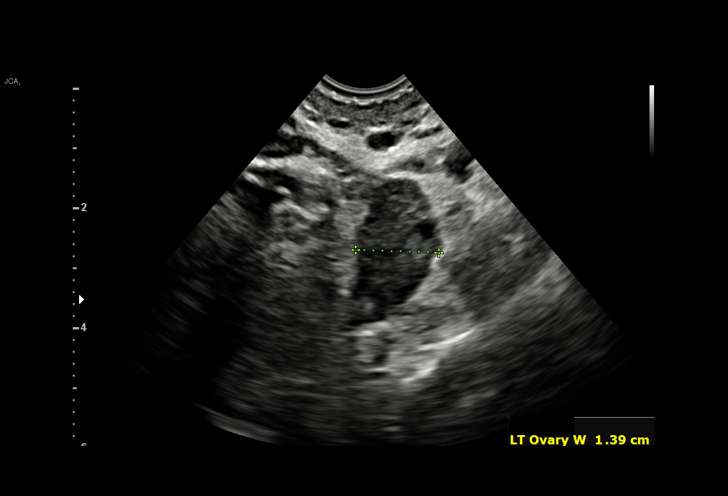

[15 of 28 positions shown; findings below may reference images not displayed]

FINDINGS: Intrauterine gestational sac: Single

Yolk sac:  Not Visualized.

Embryo:  Not Visualized.

MSD: 5.6 mm   5 w   2 d

Maternal uterus/adnexae: Bilateral ovaries appear within normal
limits. Right ovary measures 3.2 x 1.7 x 3.2 cm. Left ovary measures
2.5 x 1.5 x 1.4 cm. There is a moderate amount of free fluid in the
pelvis.
IMPRESSION: 1. Single sac-like structure in the endometrium corresponding to
gestational age of 5 weeks and 2 days. No fetal pole or yolk sac
identified to confirm.
2. Ovaries within normal limits.  No adnexal masses.
3. Moderate free fluid in the pelvis.

In the setting of a positive pregnancy test, findings may be related
to early normal IUP, occult ectopic pregnancy, or abortion in
progress. Recommend close interval follow-up.

## 2022-02-19 DIAGNOSIS — Z419 Encounter for procedure for purposes other than remedying health state, unspecified: Secondary | ICD-10-CM | POA: Diagnosis not present

## 2022-03-04 DIAGNOSIS — Z113 Encounter for screening for infections with a predominantly sexual mode of transmission: Secondary | ICD-10-CM | POA: Diagnosis not present

## 2022-03-04 DIAGNOSIS — Z331 Pregnant state, incidental: Secondary | ICD-10-CM | POA: Diagnosis not present

## 2022-03-04 DIAGNOSIS — Z124 Encounter for screening for malignant neoplasm of cervix: Secondary | ICD-10-CM | POA: Diagnosis not present

## 2022-03-04 DIAGNOSIS — Z3481 Encounter for supervision of other normal pregnancy, first trimester: Secondary | ICD-10-CM | POA: Diagnosis not present

## 2022-03-04 DIAGNOSIS — N760 Acute vaginitis: Secondary | ICD-10-CM | POA: Diagnosis not present

## 2022-03-04 DIAGNOSIS — Z3A29 29 weeks gestation of pregnancy: Secondary | ICD-10-CM | POA: Diagnosis not present

## 2022-03-04 DIAGNOSIS — Z3493 Encounter for supervision of normal pregnancy, unspecified, third trimester: Secondary | ICD-10-CM | POA: Diagnosis not present

## 2022-03-04 DIAGNOSIS — Z3A11 11 weeks gestation of pregnancy: Secondary | ICD-10-CM | POA: Diagnosis not present

## 2022-03-04 DIAGNOSIS — R87810 Cervical high risk human papillomavirus (HPV) DNA test positive: Secondary | ICD-10-CM | POA: Diagnosis not present

## 2022-03-04 DIAGNOSIS — Z01419 Encounter for gynecological examination (general) (routine) without abnormal findings: Secondary | ICD-10-CM | POA: Diagnosis not present

## 2022-03-04 DIAGNOSIS — O3680X9 Pregnancy with inconclusive fetal viability, other fetus: Secondary | ICD-10-CM | POA: Diagnosis not present

## 2022-03-04 DIAGNOSIS — O219 Vomiting of pregnancy, unspecified: Secondary | ICD-10-CM | POA: Diagnosis not present

## 2022-03-04 DIAGNOSIS — Z369 Encounter for antenatal screening, unspecified: Secondary | ICD-10-CM | POA: Diagnosis not present

## 2022-03-04 DIAGNOSIS — Z3402 Encounter for supervision of normal first pregnancy, second trimester: Secondary | ICD-10-CM | POA: Diagnosis not present

## 2022-03-04 DIAGNOSIS — N898 Other specified noninflammatory disorders of vagina: Secondary | ICD-10-CM | POA: Diagnosis not present

## 2022-03-04 DIAGNOSIS — Z01411 Encounter for gynecological examination (general) (routine) with abnormal findings: Secondary | ICD-10-CM | POA: Diagnosis not present

## 2022-03-22 DIAGNOSIS — Z419 Encounter for procedure for purposes other than remedying health state, unspecified: Secondary | ICD-10-CM | POA: Diagnosis not present

## 2022-04-22 DIAGNOSIS — Z419 Encounter for procedure for purposes other than remedying health state, unspecified: Secondary | ICD-10-CM | POA: Diagnosis not present

## 2022-04-28 ENCOUNTER — Encounter (HOSPITAL_COMMUNITY): Payer: Self-pay | Admitting: Emergency Medicine

## 2022-04-28 ENCOUNTER — Telehealth (HOSPITAL_COMMUNITY): Payer: Self-pay

## 2022-04-28 ENCOUNTER — Ambulatory Visit (HOSPITAL_COMMUNITY)
Admission: EM | Admit: 2022-04-28 | Discharge: 2022-04-28 | Disposition: A | Discharge status: Home / Self Care | Payer: Medicaid Other | Attending: Emergency Medicine | Admitting: Emergency Medicine

## 2022-04-28 DIAGNOSIS — L250 Unspecified contact dermatitis due to cosmetics: Secondary | ICD-10-CM

## 2022-04-28 MED ORDER — TRIAMCINOLONE ACETONIDE 0.025 % EX OINT: 1.0000 | TOPICAL_OINTMENT | Freq: Two times a day (BID) | CUTANEOUS | 0 refills | Status: DC

## 2022-04-28 NOTE — Discharge Instructions (Signed)
Use ointment as prescribed. Follow-up with dermatologist if persistent rash in a week.

## 2022-04-28 NOTE — ED Provider Notes (Signed)
[facility]    CSN: [id number] Arrival date & time: 04/28/22  1714      History   Chief Complaint Chief Complaint  Patient presents with   Rash    HPI Karen Frederick is a 26 y.o. female.   Patient presents with concerns of rash on her face. She states she recently started using new skin care products on her face but they seemed to irritate her face so she stopped using them. A couple days after stopping she noticed a red, itchy, flaky rash to her left temple. She states the rash seems to have gotten worse even though she is not using the products anymore. She has not taken or tried anything for it. She has felt well otherwise. The patient is currently pregnant, due on September 20, 2022.   The history is provided by the patient.  Rash Associated symptoms: no fatigue, no fever and no headaches     Past Medical History:  Diagnosis Date   Anemia    with pregnacy   Bipolar depression (HCC)    dx age 58   Depression    Dyspnea    history, no current problem   Gonorrhea    Neuromuscular disorder (HCC)    patient denies this dx on 09/24/21   Trichomonal vaginitis    Vaginal Pap smear, abnormal     Patient Active Problem List   Diagnosis Date Noted   Vaginal delivery 02/14/2016   Obesity 02/13/2016   Anemia affecting pregnancy 02/13/2016   Late prenatal care 02/13/2016    Past Surgical History:  Procedure Laterality Date   DILATION AND EVACUATION N/A 07/17/2019   Procedure: DILATATION AND EVACUATION;  Surgeon: Osborn Coho, MD;  Location: [facility] OR;  Service: Gynecology;  Laterality: N/A;   DILATION AND EVACUATION N/A 09/27/2021   Procedure: DILATATION AND EVACUATION;  Surgeon: Essie Hart, MD;  Location: [facility] OR;  Service: Gynecology;  Laterality: N/A;   HERNIA REPAIR     umbilical- age 30    OB History     Gravida  5   Para  1   Term  1   Preterm      AB  3   Living  1      SAB  2   IAB  0   Ectopic      Multiple  0   Live Births  1             Home Medications    Prior to Admission medications   Medication Sig Start Date End Date Taking? Authorizing Provider  triamcinolone (KENALOG) 0.025 % ointment Apply 1 Application topically 2 (two) times daily. 04/28/22  Yes Taha Dimond L, PA  doxycycline (VIBRA-TABS) 100 MG tablet Take 100 mg by mouth 2 (two) times daily. 7 day supply Patient not taking: Reported on 09/27/2021 09/24/21   [provider]  HYDROcodone-acetaminophen (NORCO/VICODIN) 5-325 MG tablet Take 1 tablet by mouth every 6 (six) hours as needed for moderate pain. 09/27/21   Essie Hart, MD  ondansetron (ZOFRAN-ODT) 4 MG disintegrating tablet Take 4 mg by mouth every 6 (six) hours as needed for nausea/vomiting. Patient not taking: Reported on 09/27/2021 09/24/21   [provider]  promethazine (PHENERGAN) 25 MG tablet Take 25 mg by mouth every 6 (six) hours as needed for nausea/vomiting. 09/02/21   [provider]    Family History Family History  Problem Relation Age of Onset   Healthy Mother    Healthy Father  Diabetes Paternal Grandmother    Cancer Paternal Grandmother        breast with mets   Alcohol abuse Neg Hx    Arthritis Neg Hx    Asthma Neg Hx    Birth defects Neg Hx    COPD Neg Hx    Depression Neg Hx    Drug abuse Neg Hx    Early death Neg Hx    Hearing loss Neg Hx    Heart disease Neg Hx    Hyperlipidemia Neg Hx    Hypertension Neg Hx    Kidney disease Neg Hx    Learning disabilities Neg Hx    Mental illness Neg Hx    Mental retardation Neg Hx    Miscarriages / Stillbirths Neg Hx    Stroke Neg Hx    Vision loss Neg Hx    Varicose Veins Neg Hx     Social History Social History   Tobacco Use   Smoking status: Some Days    Packs/day: 0.25    Years: 10.00    Total pack years: 2.50    Types: Cigarettes   Smokeless tobacco: Never   Tobacco comments:    trying to quit  Vaping Use   Vaping Use: Former   Quit date: 07/22/2021  Substance Use Topics   Alcohol  use: Not Currently   Drug use: Not Currently    Types: Marijuana    Comment: last used 09/23/21     Allergies   Patient has no known allergies.   Review of Systems Review of Systems  Constitutional:  Negative for fatigue and fever.  Eyes:  Negative for visual disturbance.  Skin:  Positive for rash.  Neurological:  Negative for headaches.     Physical Exam Triage Vital Signs ED Triage Vitals  Enc Vitals Group     BP 04/28/22 1736 129/68     Pulse Rate 04/28/22 1734 89     Resp 04/28/22 1734 18     Temp 04/28/22 1734 98.9 F (37.2 C)     Temp Source 04/28/22 1734 Oral     SpO2 04/28/22 1734 100 %     Weight --      Height --      Head Circumference --      Peak Flow --      Pain Score 04/28/22 1732 0     Pain Loc --      Pain Edu? --      Excl. in GC? --    No data found.  Updated Vital Signs BP 129/68   Pulse 89   Temp 98.9 F (37.2 C) (Oral)   Resp 18   LMP 07/23/2021   SpO2 100%   Visual Acuity Right Eye Distance:   Left Eye Distance:   Bilateral Distance:    Right Eye Near:   Left Eye Near:    Bilateral Near:     Physical Exam Vitals and nursing note reviewed.  Constitutional:      General: She is not in acute distress. HENT:     Head: Normocephalic and atraumatic.  Cardiovascular:     Pulses: Normal pulses.  Pulmonary:     Effort: Pulmonary effort is normal.  Skin:    Comments: Erythematous plaque-like rash to left temple. No tenderness. Some flaking skin. No other rash noted to face.   Neurological:     Mental Status: She is alert.  Psychiatric:        Mood and Affect: Mood normal.  UC Treatments / Results  Labs (all labs ordered are listed, but only abnormal results are displayed) Labs Reviewed - No data to display  EKG   Radiology No results found.  Procedures Procedures (including critical care time)  Medications Ordered in UC Medications - No data to display  Initial Impression / Assessment and Plan / UC Course   I have reviewed the triage vital signs and the nursing notes.  Pertinent labs & imaging results that were available during my care of the patient were reviewed by me and considered in my medical decision making (see chart for details).     Topical steroid for contact dermatitis, question possible eczema component. Discussed follow-up precautions if no improvement.   E/M: 1 acute uncomplicated illness, no data, moderate risk due to prescription management  Final Clinical Impressions(s) / UC Diagnoses   Final diagnoses:  Contact dermatitis due to cosmetics, unspecified contact dermatitis type     Discharge Instructions      Use ointment as prescribed. Follow-up with dermatologist if persistent rash in a week.     ED Prescriptions     Medication Sig Dispense Auth. Provider   triamcinolone (KENALOG) 0.025 % ointment Apply 1 Application topically 2 (two) times daily. 30 g Vallery Sa, Quadasia Newsham L, [place]      PDMP not reviewed this encounter.   Estanislado Pandy, [place] 04/28/22 1807

## 2022-04-28 NOTE — ED Triage Notes (Signed)
Pt reports new skin care regimen and rash started on face after stopping regimen. Skin feels tight, itching and irritated. Is pregnant about 5 months.

## 2022-05-02 DIAGNOSIS — Z363 Encounter for antenatal screening for malformations: Secondary | ICD-10-CM | POA: Diagnosis not present

## 2022-05-02 DIAGNOSIS — Z3A19 19 weeks gestation of pregnancy: Secondary | ICD-10-CM | POA: Diagnosis not present

## 2022-05-22 DIAGNOSIS — Z419 Encounter for procedure for purposes other than remedying health state, unspecified: Secondary | ICD-10-CM | POA: Diagnosis not present

## 2022-05-30 DIAGNOSIS — Z131 Encounter for screening for diabetes mellitus: Secondary | ICD-10-CM | POA: Diagnosis not present

## 2022-06-22 DIAGNOSIS — Z419 Encounter for procedure for purposes other than remedying health state, unspecified: Secondary | ICD-10-CM | POA: Diagnosis not present

## 2022-07-14 ENCOUNTER — Encounter (HOSPITAL_COMMUNITY): Payer: Self-pay

## 2022-07-14 ENCOUNTER — Inpatient Hospital Stay (HOSPITAL_COMMUNITY)
Admission: AD | Admit: 2022-07-14 | Discharge: 2022-07-15 | Disposition: A | Discharge status: Home / Self Care | Payer: Medicaid Other | Attending: Obstetrics and Gynecology | Admitting: Obstetrics and Gynecology

## 2022-07-14 DIAGNOSIS — R1031 Right lower quadrant pain: Secondary | ICD-10-CM | POA: Insufficient documentation

## 2022-07-14 DIAGNOSIS — M25559 Pain in unspecified hip: Secondary | ICD-10-CM | POA: Insufficient documentation

## 2022-07-14 DIAGNOSIS — O9A219 Injury, poisoning and certain other consequences of external causes complicating pregnancy, unspecified trimester: Secondary | ICD-10-CM

## 2022-07-14 DIAGNOSIS — O9A313 Physical abuse complicating pregnancy, third trimester: Secondary | ICD-10-CM | POA: Insufficient documentation

## 2022-07-14 DIAGNOSIS — Z87891 Personal history of nicotine dependence: Secondary | ICD-10-CM | POA: Insufficient documentation

## 2022-07-14 DIAGNOSIS — O26899 Other specified pregnancy related conditions, unspecified trimester: Secondary | ICD-10-CM

## 2022-07-14 DIAGNOSIS — Z3A30 30 weeks gestation of pregnancy: Secondary | ICD-10-CM
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
## 2022-07-15 ENCOUNTER — Encounter (HOSPITAL_COMMUNITY): Payer: Self-pay | Admitting: Obstetrics and Gynecology

## 2022-07-15 DIAGNOSIS — Z87891 Personal history of nicotine dependence: Secondary | ICD-10-CM | POA: Diagnosis not present

## 2022-07-15 DIAGNOSIS — Z3A30 30 weeks gestation of pregnancy: Secondary | ICD-10-CM | POA: Diagnosis not present

## 2022-07-15 DIAGNOSIS — O9A313 Physical abuse complicating pregnancy, third trimester: Secondary | ICD-10-CM | POA: Diagnosis present

## 2022-07-15 DIAGNOSIS — M25559 Pain in unspecified hip: Secondary | ICD-10-CM | POA: Diagnosis present

## 2022-07-15 DIAGNOSIS — R1031 Right lower quadrant pain: Secondary | ICD-10-CM | POA: Diagnosis present

## 2022-07-15 LAB — URINALYSIS, ROUTINE W REFLEX MICROSCOPIC
Bilirubin Urine: NEGATIVE
Glucose, UA: NEGATIVE mg/dL
Hgb urine dipstick: NEGATIVE
Ketones, ur: NEGATIVE mg/dL
Leukocytes,Ua: NEGATIVE
Nitrite: NEGATIVE
Protein, ur: 30 mg/dL — AB
Specific Gravity, Urine: 1.026 (ref 1.005–1.030)
pH: 6 (ref 5.0–8.0)

## 2022-07-15 MED ORDER — ACETAMINOPHEN 325 MG PO TABS
650.0000 mg | ORAL_TABLET | Freq: Once | ORAL | Status: AC
  Administered 2022-07-15: 650 mg via ORAL
  Filled 2022-07-15: qty 2

## 2022-07-15 MED ORDER — CYCLOBENZAPRINE HCL 5 MG PO TABS
10.0000 mg | ORAL_TABLET | Freq: Once | ORAL | Status: AC
  Administered 2022-07-15: 10 mg via ORAL
  Filled 2022-07-15: qty 2

## 2022-07-15 MED ORDER — CYCLOBENZAPRINE HCL 5 MG PO TABS: 5.0000 mg | ORAL_TABLET | Freq: Three times a day (TID) | ORAL | 0 refills | Status: DC | PRN
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
## 2022-07-22 DIAGNOSIS — Z419 Encounter for procedure for purposes other than remedying health state, unspecified: Secondary | ICD-10-CM | POA: Diagnosis not present

## 2022-08-02 ENCOUNTER — Encounter (HOSPITAL_COMMUNITY): Payer: Self-pay | Admitting: Obstetrics and Gynecology

## 2022-08-02 ENCOUNTER — Inpatient Hospital Stay (HOSPITAL_COMMUNITY)
Admission: AD | Admit: 2022-08-02 | Discharge: 2022-08-02 | Disposition: A | Discharge status: Home / Self Care | Payer: Medicaid Other | Attending: Obstetrics and Gynecology | Admitting: Obstetrics and Gynecology

## 2022-08-02 DIAGNOSIS — Z3A33 33 weeks gestation of pregnancy: Secondary | ICD-10-CM | POA: Insufficient documentation

## 2022-08-02 DIAGNOSIS — O219 Vomiting of pregnancy, unspecified: Secondary | ICD-10-CM

## 2022-08-02 DIAGNOSIS — O99891 Other specified diseases and conditions complicating pregnancy: Secondary | ICD-10-CM | POA: Diagnosis not present

## 2022-08-02 DIAGNOSIS — O26893 Other specified pregnancy related conditions, third trimester: Secondary | ICD-10-CM | POA: Diagnosis not present

## 2022-08-02 DIAGNOSIS — R197 Diarrhea, unspecified: Secondary | ICD-10-CM | POA: Diagnosis not present

## 2022-08-02 LAB — CBC
HCT: 33 % — ABNORMAL LOW (ref 36.0–46.0)
Hemoglobin: 10.8 g/dL — ABNORMAL LOW (ref 12.0–15.0)
MCH: 27.8 pg (ref 26.0–34.0)
MCHC: 32.7 g/dL (ref 30.0–36.0)
MCV: 84.8 fL (ref 80.0–100.0)
Platelets: 143 10*3/uL — ABNORMAL LOW (ref 150–400)
RBC: 3.89 MIL/uL (ref 3.87–5.11)
RDW: 14.4 % (ref 11.5–15.5)
WBC: 7.2 10*3/uL (ref 4.0–10.5)
nRBC: 0 % (ref 0.0–0.2)

## 2022-08-02 LAB — COMPREHENSIVE METABOLIC PANEL
ALT: 22 U/L (ref 0–44)
AST: 23 U/L (ref 15–41)
Albumin: 2.7 g/dL — ABNORMAL LOW (ref 3.5–5.0)
Alkaline Phosphatase: 124 U/L (ref 38–126)
Anion gap: 9 (ref 5–15)
BUN: 5 mg/dL — ABNORMAL LOW (ref 6–20)
CO2: 23 mmol/L (ref 22–32)
Calcium: 8.2 mg/dL — ABNORMAL LOW (ref 8.9–10.3)
Chloride: 107 mmol/L (ref 98–111)
Creatinine, Ser: 0.5 mg/dL (ref 0.44–1.00)
GFR, Estimated: >60 mL/min (ref >60)
Glucose, Bld: 102 mg/dL — ABNORMAL HIGH (ref 70–99)
Potassium: 3.8 mmol/L (ref 3.5–5.1)
Sodium: 139 mmol/L (ref 135–145)
Total Bilirubin: 0.5 mg/dL (ref 0.3–1.2)
Total Protein: 6.5 g/dL (ref 6.5–8.1)

## 2022-08-02 MED ORDER — LACTATED RINGERS IV BOLUS
1000.0000 mL | Freq: Once | INTRAVENOUS | Status: AC
  Administered 2022-08-02: 1000 mL via INTRAVENOUS

## 2022-08-02 MED ORDER — ONDANSETRON HCL 4 MG/2ML IJ SOLN
4.0000 mg | Freq: Once | INTRAMUSCULAR | Status: AC
Start: 2022-08-02 — End: 2022-08-02
  Administered 2022-08-02: 4 mg via INTRAVENOUS
  Filled 2022-08-02: qty 2

## 2022-08-02 MED ORDER — LOPERAMIDE HCL 2 MG PO CAPS: 2.0000 mg | ORAL_CAPSULE | Freq: Four times a day (QID) | ORAL | 0 refills | Status: DC | PRN

## 2022-08-02 MED ORDER — LOPERAMIDE HCL 2 MG PO CAPS
2.0000 mg | ORAL_CAPSULE | Freq: Once | ORAL | Status: AC
  Administered 2022-08-02: 2 mg via ORAL
  Filled 2022-08-02: qty 1

## 2022-08-02 MED ORDER — ONDANSETRON 4 MG PO TBDP: 4.0000 mg | ORAL_TABLET | Freq: Four times a day (QID) | ORAL | 0 refills | Status: DC | PRN

## 2022-08-02 NOTE — MAU Note (Signed)
Pt says she started diarrhea at 10pm and  Vomiting started at 0300.  [facility] Thinks food poisoning - cramping and diarrhea

## 2022-08-02 NOTE — MAU Provider Note (Addendum)
History     CSN: [id number]  Arrival date and time: 08/02/22 0542   None     Chief Complaint  Patient presents with   Emesis   HPI Karen Frederick is a 26 y.o. G5P1031 at [redacted]w[redacted]d who presents to MAU for nausea, vomiting, and diarrhea. She reports diarrhea started at 10pm and has had 6 episodes of watery stools. She reports only 2 episodes of vomiting. Is having a lot of abdominal cramping and feels weak and dehydrated. She denies fever, chills or known sick contacts. She works from home, has a 26 y.o. that goes to school so is unsure if he has been around anyone sick but reports he has not been sick. She denies bleeding or leaking fluid. Endorses active fetal movement.  Patient receives [facility] at [facility].  OB History     Gravida  5   Para  1   Term  1   Preterm      AB  3   Living  1      SAB  2   IAB  0   Ectopic      Multiple  0   Live Births  1           Past Medical History:  Diagnosis Date   Anemia    with pregnacy   Bipolar depression (HCC)    dx age 50   Depression    Dyspnea    history, no current problem   Gonorrhea    Neuromuscular disorder (HCC)    patient denies this dx on 09/24/21   Trichomonal vaginitis    Vaginal Pap smear, abnormal     Past Surgical History:  Procedure Laterality Date   DILATION AND EVACUATION N/A 07/17/2019   Procedure: DILATATION AND EVACUATION;  Surgeon: Osborn Coho, MD;  Location: [facility] OR;  Service: Gynecology;  Laterality: N/A;   DILATION AND EVACUATION N/A 09/27/2021   Procedure: DILATATION AND EVACUATION;  Surgeon: Essie Hart, MD;  Location: [facility] OR;  Service: Gynecology;  Laterality: N/A;   HERNIA REPAIR     umbilical- age 28    Family History  Problem Relation Age of Onset   Healthy Mother    Healthy Father    Diabetes Paternal Grandmother    Cancer Paternal Grandmother        breast with mets   Alcohol abuse Neg Hx    Arthritis Neg Hx    Asthma Neg Hx    Birth defects Neg Hx    COPD Neg Hx     Depression Neg Hx    Drug abuse Neg Hx    Early death Neg Hx    Hearing loss Neg Hx    Heart disease Neg Hx    Hyperlipidemia Neg Hx    Hypertension Neg Hx    Kidney disease Neg Hx    Learning disabilities Neg Hx    Mental illness Neg Hx    Mental retardation Neg Hx    Miscarriages / Stillbirths Neg Hx    Stroke Neg Hx    Vision loss Neg Hx    Varicose Veins Neg Hx     Social History   Tobacco Use   Smoking status: Former    Packs/day: 0.25    Years: 10.00    Total pack years: 2.50    Types: Cigarettes   Smokeless tobacco: Never   Tobacco comments:    trying to quit  Vaping Use   Vaping Use: Every day   Last  attempt to quit: 07/22/2021  Substance Use Topics   Alcohol use: Not Currently   Drug use: Not Currently    Types: Marijuana    Comment: last used 09/23/21    Allergies: No Known Allergies  Medications Prior to Admission  Medication Sig Dispense Refill Last Dose   cyclobenzaprine (FLEXERIL) 5 MG tablet Take 1 tablet (5 mg total) by mouth 3 (three) times daily as needed for muscle spasms (body aches). 20 tablet 0 Past Week   Prenatal Vit-Fe Fumarate-FA (MULTIVITAMIN-PRENATAL) 27-0.8 MG TABS tablet Take 1 tablet by mouth daily at 12 noon.   08/01/2022   promethazine (PHENERGAN) 25 MG tablet Take 25 mg by mouth every 6 (six) hours as needed for nausea/vomiting.   More than a month   triamcinolone (KENALOG) 0.025 % ointment Apply 1 Application topically 2 (two) times daily. 30 g 0    Review of Systems  Constitutional: Negative.   Respiratory: Negative.    Cardiovascular: Negative.   Gastrointestinal:  Positive for abdominal pain (cramping), diarrhea, nausea and vomiting.  Genitourinary: Negative.   Musculoskeletal: Negative.   Neurological: Negative.    Physical Exam   Blood pressure (!) 109/54, pulse 96, temperature 97.9 F (36.6 C), temperature source Oral, resp. rate 20, height 5\' 7"  (1.702 m), weight 102.5 kg, last menstrual period 07/23/2021.  Physical  Exam Vitals and nursing note reviewed.  Constitutional:      General: She is not in acute distress. Eyes:     Extraocular Movements: Extraocular movements intact.     Pupils: Pupils are equal, round, and reactive to light.  Cardiovascular:     Rate and Rhythm: Tachycardia present.  Pulmonary:     Effort: Pulmonary effort is normal.  Abdominal:     Palpations: Abdomen is soft.     Tenderness: There is no abdominal tenderness.  Musculoskeletal:        General: Normal range of motion.     Cervical back: Normal range of motion.  Skin:    General: Skin is warm and dry.  Neurological:     General: No focal deficit present.     Mental Status: She is alert and oriented to person, place, and time.  Psychiatric:        Mood and Affect: Mood normal.        Behavior: Behavior normal.    NST FHR: 155 bpm, moderate variability, +15x15 accels, no decels Toco: 1 uc  No results found for this or any previous visit (from the past 24 hour(s)).  MAU Course  Procedures  MDM UA LR Zofran, Imodium CBC, CMP  On arrival, vital signs stable, patient afebrile, mild tachycardia, however patient unable to urinate and FHR slightly tachycardic so LR bolus started. Will give Zofran and Imodium. CBC and CMP pending.  Care handed over to S. 14/09/2020, CNM at 0750  Reita Cliche, CNM 08/02/22 7:51 AM  Results for orders placed or performed during the hospital encounter of 08/02/22 (from the past 24 hour(s))  CBC     Status: Abnormal   Collection Time: 08/02/22  7:39 AM  Result Value Ref Range   WBC 7.2 4.0 - 10.5 K/uL   RBC 3.89 3.87 - 5.11 MIL/uL   Hemoglobin 10.8 (L) 12.0 - 15.0 g/dL   HCT 14/12/23 (L) 68.3 - 41.9 %   MCV 84.8 80.0 - 100.0 fL   MCH 27.8 26.0 - 34.0 pg   MCHC 32.7 30.0 - 36.0 g/dL   RDW 62.2 29.7 - 98.9 %  Platelets 143 (L) 150 - 400 K/uL   nRBC 0.0 0.0 - 0.2 %  Comprehensive metabolic panel     Status: Abnormal   Collection Time: 08/02/22  7:39 AM  Result Value Ref  Range   Sodium 139 135 - 145 mmol/L   Potassium 3.8 3.5 - 5.1 mmol/L   Chloride 107 98 - 111 mmol/L   CO2 23 22 - 32 mmol/L   Glucose, Bld 102 (H) 70 - 99 mg/dL   BUN 5 (L) 6 - 20 mg/dL   Creatinine, Ser 6.56 0.44 - 1.00 mg/dL   Calcium 8.2 (L) 8.9 - 10.3 mg/dL   Total Protein 6.5 6.5 - 8.1 g/dL   Albumin 2.7 (L) 3.5 - 5.0 g/dL   AST 23 15 - 41 U/L   ALT 22 0 - 44 U/L   Alkaline Phosphatase 124 38 - 126 U/L   Total Bilirubin 0.5 0.3 - 1.2 mg/dL   GFR, Estimated >81 >27 mL/min   Anion gap 9 5 - 15   Meds ordered this encounter  Medications   lactated ringers bolus 1,000 mL   ondansetron (ZOFRAN) injection 4 mg   loperamide (IMODIUM) capsule 2 mg   ondansetron (ZOFRAN-ODT) 4 MG disintegrating tablet    Sig: Take 1 tablet (4 mg total) by mouth every 6 (six) hours as needed for nausea.    Dispense:  20 tablet    Refill:  0    Order Specific Question:   Supervising Provider    Answer:   Fabio Bering   loperamide (IMODIUM) 2 MG capsule    Sig: Take 1 capsule (2 mg total) by mouth 4 (four) times daily as needed for diarrhea or loose stools.    Dispense:  12 capsule    Refill:  0    Order Specific Question:   Supervising Provider    Answer:   Milas Hock [[id number]]   Assessment and Plan  --26 y.o.G5P1031 at [redacted]w[redacted]d  --Reactive tracing: baseline 150, mod var, + accels, no decels --Toco: quiet --Diarrhea and vomiting, no episodes during MAU encounter --Patient well-appearing prior to discharge --New regimen in event of a recurrence of either symptom --Discharge home in stable condition  Clayton Bibles, MSA, MSN, CNM Certified Nurse Midwife, [facility] 08/02/22 11:12 AM

## 2022-08-05 DIAGNOSIS — Z3483 Encounter for supervision of other normal pregnancy, third trimester: Secondary | ICD-10-CM | POA: Diagnosis not present

## 2022-08-05 DIAGNOSIS — Z3482 Encounter for supervision of other normal pregnancy, second trimester: Secondary | ICD-10-CM | POA: Diagnosis not present

## 2022-08-22 DIAGNOSIS — Z419 Encounter for procedure for purposes other than remedying health state, unspecified: Secondary | ICD-10-CM | POA: Diagnosis not present

## 2022-08-22 NOTE — L&D Delivery Note (Signed)
Delivery Note Labor onset: 09/02/2022  Labor Onset Time: 1900 Complete dilation at 5:55 AM  Onset of pushing at 0600 FHR second stage Cat 1 Analgesia/Anesthesia intrapartum: epidural  Guided pushing with maternal urge. Delivery of a viable female at 0609. Fetal head delivered in LOA position.  Nuchal cord: none.  Infant placed on maternal abd, dried, and tactile stim.  Cord double clamped after 3.5 min and cut by Father, Karen Frederick.  RN x2 present for birth.  Cord blood sample collected: yes Arterial cord blood sample collected: N/A  3VC placenta retained and unable to manually remove. Epidural re-dosed by Dr. Smith Robert for comfort. Pt remains unable to tolerate attempts at manual extraction reporting significant pain. Dr. Mancel Bale notified. Scant bleeding, fundus firm, pt is stable condition.   QBL at time of handoff is 638 mL  Complications: retained  APGAR: APGAR (1 MIN): 8   APGAR (5 MINS): 9    Baby boy  Circumcision requested by parents  Arrie Eastern DNP, CNM 09/03/2022, 7:07 AM

## 2022-08-24 DIAGNOSIS — Z362 Encounter for other antenatal screening follow-up: Secondary | ICD-10-CM | POA: Diagnosis not present

## 2022-09-03 ENCOUNTER — Other Ambulatory Visit: Payer: Self-pay

## 2022-09-03 ENCOUNTER — Inpatient Hospital Stay (HOSPITAL_COMMUNITY): Payer: Medicaid Other | Admitting: Anesthesiology

## 2022-09-03 ENCOUNTER — Inpatient Hospital Stay (HOSPITAL_COMMUNITY)
Admission: AD | Admit: 2022-09-03 | Discharge: 2022-09-05 | DRG: 797 | Disposition: A | Discharge status: Home / Self Care | Payer: Medicaid Other | Attending: Obstetrics and Gynecology | Admitting: Obstetrics and Gynecology

## 2022-09-03 ENCOUNTER — Inpatient Hospital Stay (HOSPITAL_COMMUNITY): Payer: Medicaid Other

## 2022-09-03 ENCOUNTER — Encounter (HOSPITAL_COMMUNITY)
Admission: AD | Disposition: A | Discharge status: Home / Self Care | Payer: Self-pay | Source: Home / Self Care | Attending: Obstetrics and Gynecology

## 2022-09-03 ENCOUNTER — Encounter (HOSPITAL_COMMUNITY): Payer: Self-pay | Admitting: Obstetrics and Gynecology

## 2022-09-03 ENCOUNTER — Inpatient Hospital Stay: Payer: Medicaid Other

## 2022-09-03 DIAGNOSIS — Z3A37 37 weeks gestation of pregnancy: Secondary | ICD-10-CM

## 2022-09-03 DIAGNOSIS — O99214 Obesity complicating childbirth: Secondary | ICD-10-CM | POA: Diagnosis present

## 2022-09-03 DIAGNOSIS — Z87891 Personal history of nicotine dependence: Secondary | ICD-10-CM | POA: Diagnosis not present

## 2022-09-03 DIAGNOSIS — D62 Acute posthemorrhagic anemia: Secondary | ICD-10-CM | POA: Diagnosis not present

## 2022-09-03 DIAGNOSIS — F32A Depression, unspecified: Secondary | ICD-10-CM | POA: Insufficient documentation

## 2022-09-03 DIAGNOSIS — Z30017 Encounter for initial prescription of implantable subdermal contraceptive: Secondary | ICD-10-CM

## 2022-09-03 DIAGNOSIS — O9902 Anemia complicating childbirth: Secondary | ICD-10-CM | POA: Diagnosis present

## 2022-09-03 DIAGNOSIS — O99334 Smoking (tobacco) complicating childbirth: Secondary | ICD-10-CM | POA: Diagnosis not present

## 2022-09-03 DIAGNOSIS — F319 Bipolar disorder, unspecified: Secondary | ICD-10-CM | POA: Insufficient documentation

## 2022-09-03 DIAGNOSIS — D509 Iron deficiency anemia, unspecified: Secondary | ICD-10-CM | POA: Diagnosis present

## 2022-09-03 DIAGNOSIS — Z8719 Personal history of other diseases of the digestive system: Secondary | ICD-10-CM | POA: Insufficient documentation

## 2022-09-03 DIAGNOSIS — O26893 Other specified pregnancy related conditions, third trimester: Secondary | ICD-10-CM | POA: Diagnosis not present

## 2022-09-03 HISTORY — PX: DILATION AND CURETTAGE OF UTERUS: SHX78

## 2022-09-03 LAB — TYPE AND SCREEN
ABO/RH(D): O POS
Antibody Screen: NEGATIVE

## 2022-09-03 LAB — CBC
HCT: 32.5 % — ABNORMAL LOW (ref 36.0–46.0)
Hemoglobin: 10.3 g/dL — ABNORMAL LOW (ref 12.0–15.0)
MCH: 27 pg (ref 26.0–34.0)
MCHC: 31.7 g/dL (ref 30.0–36.0)
MCV: 85.1 fL (ref 80.0–100.0)
Platelets: 152 10*3/uL (ref 150–400)
RBC: 3.82 MIL/uL — ABNORMAL LOW (ref 3.87–5.11)
RDW: 14.8 % (ref 11.5–15.5)
WBC: 8.5 10*3/uL (ref 4.0–10.5)
nRBC: 0 % (ref 0.0–0.2)

## 2022-09-03 LAB — RPR: RPR Ser Ql: NONREACTIVE

## 2022-09-03 LAB — HIV ANTIBODY (ROUTINE TESTING W REFLEX): HIV Screen 4th Generation wRfx: NONREACTIVE

## 2022-09-03 SURGERY — DILATION AND CURETTAGE
Anesthesia: Epidural | Site: Vagina

## 2022-09-03 SURGERY — DILATION AND CURETTAGE
Anesthesia: Regional

## 2022-09-03 MED ORDER — FENTANYL CITRATE (PF) 100 MCG/2ML IJ SOLN
INTRAMUSCULAR | Status: AC
  Filled 2022-09-03: qty 2

## 2022-09-03 MED ORDER — PHENYLEPHRINE 80 MCG/ML (10ML) SYRINGE FOR IV PUSH (FOR BLOOD PRESSURE SUPPORT): 80.0000 ug | PREFILLED_SYRINGE | INTRAVENOUS | Status: DC | PRN

## 2022-09-03 MED ORDER — OXYTOCIN BOLUS FROM INFUSION
333.0000 mL | Freq: Once | INTRAVENOUS | Status: AC
  Administered 2022-09-03: 333 mL via INTRAVENOUS

## 2022-09-03 MED ORDER — TETANUS-DIPHTH-ACELL PERTUSSIS 5-2.5-18.5 LF-MCG/0.5 IM SUSY: 0.5000 mL | PREFILLED_SYRINGE | Freq: Once | INTRAMUSCULAR | Status: DC

## 2022-09-03 MED ORDER — OXYTOCIN-SODIUM CHLORIDE 30-0.9 UT/500ML-% IV SOLN
2.5000 [IU]/h | INTRAVENOUS | Status: DC
  Administered 2022-09-03: 2.5 [IU]/h via INTRAVENOUS
  Filled 2022-09-03 (×2): qty 500

## 2022-09-03 MED ORDER — SIMETHICONE 80 MG PO CHEW: 80.0000 mg | CHEWABLE_TABLET | ORAL | Status: DC | PRN

## 2022-09-03 MED ORDER — OXYCODONE-ACETAMINOPHEN 5-325 MG PO TABS: 2.0000 | ORAL_TABLET | ORAL | Status: DC | PRN

## 2022-09-03 MED ORDER — HYDROMORPHONE HCL 1 MG/ML IJ SOLN: 0.2500 mg | INTRAMUSCULAR | Status: DC | PRN

## 2022-09-03 MED ORDER — LACTATED RINGERS IV SOLN: INTRAVENOUS | Status: DC | PRN

## 2022-09-03 MED ORDER — AMISULPRIDE (ANTIEMETIC) 5 MG/2ML IV SOLN: 10.0000 mg | Freq: Once | INTRAVENOUS | Status: DC | PRN

## 2022-09-03 MED ORDER — OXYTOCIN-SODIUM CHLORIDE 30-0.9 UT/500ML-% IV SOLN
INTRAVENOUS | Status: AC
  Filled 2022-09-03: qty 500

## 2022-09-03 MED ORDER — LIDOCAINE-EPINEPHRINE 2 %-1:100000 IJ SOLN
INTRAMUSCULAR | Status: DC | PRN
  Administered 2022-09-03: 10 mL via INTRADERMAL
  Administered 2022-09-03: 2 mL via INTRADERMAL
  Administered 2022-09-03: 3 mL via INTRADERMAL

## 2022-09-03 MED ORDER — IBUPROFEN 600 MG PO TABS
600.0000 mg | ORAL_TABLET | Freq: Four times a day (QID) | ORAL | Status: DC
  Administered 2022-09-03 – 2022-09-05 (×8): 600 mg via ORAL
  Filled 2022-09-03 (×8): qty 1

## 2022-09-03 MED ORDER — PHENYLEPHRINE HCL-NACL 20-0.9 MG/250ML-% IV SOLN
INTRAVENOUS | Status: AC
  Filled 2022-09-03: qty 250

## 2022-09-03 MED ORDER — OXYCODONE HCL 5 MG PO TABS
5.0000 mg | ORAL_TABLET | ORAL | Status: DC | PRN
  Administered 2022-09-03 – 2022-09-04 (×2): 5 mg via ORAL
  Filled 2022-09-03 (×2): qty 1

## 2022-09-03 MED ORDER — LACTATED RINGERS IV SOLN
500.0000 mL | INTRAVENOUS | Status: DC | PRN
  Administered 2022-09-03: 500 mL via INTRAVENOUS

## 2022-09-03 MED ORDER — DEXMEDETOMIDINE HCL IN NACL 80 MCG/20ML IV SOLN
INTRAVENOUS | Status: DC | PRN
  Administered 2022-09-03: 8 ug via INTRAVENOUS
  Administered 2022-09-03 (×3): 4 ug via INTRAVENOUS

## 2022-09-03 MED ORDER — ONDANSETRON HCL 4 MG/2ML IJ SOLN: 4.0000 mg | Freq: Four times a day (QID) | INTRAMUSCULAR | Status: DC | PRN

## 2022-09-03 MED ORDER — FLEET ENEMA 7-19 GM/118ML RE ENEM: 1.0000 | ENEMA | RECTAL | Status: DC | PRN

## 2022-09-03 MED ORDER — FENTANYL CITRATE (PF) 100 MCG/2ML IJ SOLN
INTRAMUSCULAR | Status: DC | PRN
  Administered 2022-09-03: 100 ug via EPIDURAL

## 2022-09-03 MED ORDER — ONDANSETRON HCL 4 MG/2ML IJ SOLN: 4.0000 mg | Freq: Once | INTRAMUSCULAR | Status: DC | PRN

## 2022-09-03 MED ORDER — LIDOCAINE-EPINEPHRINE (PF) 2 %-1:200000 IJ SOLN
INTRAMUSCULAR | Status: AC
  Filled 2022-09-03: qty 20

## 2022-09-03 MED ORDER — FENTANYL CITRATE (PF) 100 MCG/2ML IJ SOLN
50.0000 ug | INTRAMUSCULAR | Status: DC | PRN
  Administered 2022-09-03: 50 ug via INTRAVENOUS

## 2022-09-03 MED ORDER — EPHEDRINE 5 MG/ML INJ: 10.0000 mg | INTRAVENOUS | Status: DC | PRN

## 2022-09-03 MED ORDER — LACTATED RINGERS IV SOLN: INTRAVENOUS | Status: DC

## 2022-09-03 MED ORDER — LACTATED RINGERS IV SOLN
500.0000 mL | Freq: Once | INTRAVENOUS | Status: AC
  Administered 2022-09-03: 500 mL via INTRAVENOUS

## 2022-09-03 MED ORDER — ONDANSETRON HCL 4 MG/2ML IJ SOLN
INTRAMUSCULAR | Status: AC
  Filled 2022-09-03: qty 2

## 2022-09-03 MED ORDER — FENTANYL-BUPIVACAINE-NACL 0.5-0.125-0.9 MG/250ML-% EP SOLN: 12.0000 mL/h | EPIDURAL | Status: DC | PRN

## 2022-09-03 MED ORDER — SENNOSIDES-DOCUSATE SODIUM 8.6-50 MG PO TABS
2.0000 | ORAL_TABLET | ORAL | Status: DC
  Administered 2022-09-03 – 2022-09-05 (×3): 2 via ORAL
  Filled 2022-09-03 (×3): qty 2

## 2022-09-03 MED ORDER — ACETAMINOPHEN 325 MG PO TABS
650.0000 mg | ORAL_TABLET | ORAL | Status: DC | PRN
  Administered 2022-09-03: 650 mg via ORAL
  Filled 2022-09-03: qty 2

## 2022-09-03 MED ORDER — SODIUM BICARBONATE 8.4 % IV SOLN
INTRAVENOUS | Status: DC | PRN
  Administered 2022-09-03: 5 mL via EPIDURAL

## 2022-09-03 MED ORDER — COCONUT OIL OIL: 1.0000 | TOPICAL_OIL | Status: DC | PRN

## 2022-09-03 MED ORDER — OXYCODONE HCL 5 MG PO TABS
10.0000 mg | ORAL_TABLET | ORAL | Status: DC | PRN
  Administered 2022-09-05 (×2): 10 mg via ORAL
  Filled 2022-09-03 (×2): qty 2

## 2022-09-03 MED ORDER — MIDAZOLAM HCL 2 MG/2ML IJ SOLN
INTRAMUSCULAR | Status: AC
  Filled 2022-09-03: qty 2

## 2022-09-03 MED ORDER — STERILE WATER FOR IRRIGATION IR SOLN
Status: DC | PRN
  Administered 2022-09-03: 1000 mL

## 2022-09-03 MED ORDER — MEPERIDINE HCL 25 MG/ML IJ SOLN: 6.2500 mg | INTRAMUSCULAR | Status: DC | PRN

## 2022-09-03 MED ORDER — DIBUCAINE (PERIANAL) 1 % EX OINT: 1.0000 | TOPICAL_OINTMENT | CUTANEOUS | Status: DC | PRN

## 2022-09-03 MED ORDER — OXYCODONE-ACETAMINOPHEN 5-325 MG PO TABS: 1.0000 | ORAL_TABLET | ORAL | Status: DC | PRN

## 2022-09-03 MED ORDER — FENTANYL CITRATE (PF) 100 MCG/2ML IJ SOLN
INTRAMUSCULAR | Status: DC | PRN
  Administered 2022-09-03: 100 ug via INTRAVENOUS

## 2022-09-03 MED ORDER — OXYCODONE HCL 5 MG PO TABS: 5.0000 mg | ORAL_TABLET | Freq: Once | ORAL | Status: DC | PRN

## 2022-09-03 MED ORDER — MIDAZOLAM HCL 2 MG/2ML IJ SOLN
INTRAMUSCULAR | Status: DC | PRN
  Administered 2022-09-03: 2 mg via INTRAVENOUS

## 2022-09-03 MED ORDER — SODIUM CHLORIDE 0.9 % IV SOLN
3.0000 g | Freq: Four times a day (QID) | INTRAVENOUS | Status: AC
  Administered 2022-09-03 – 2022-09-04 (×4): 3 g via INTRAVENOUS
  Filled 2022-09-03 (×5): qty 8

## 2022-09-03 MED ORDER — ACETAMINOPHEN 325 MG PO TABS: 650.0000 mg | ORAL_TABLET | ORAL | Status: DC | PRN

## 2022-09-03 MED ORDER — DIPHENHYDRAMINE HCL 25 MG PO CAPS: 25.0000 mg | ORAL_CAPSULE | Freq: Four times a day (QID) | ORAL | Status: DC | PRN

## 2022-09-03 MED ORDER — PRENATAL MULTIVITAMIN CH
1.0000 | ORAL_TABLET | Freq: Every day | ORAL | Status: DC
  Administered 2022-09-03 – 2022-09-05 (×3): 1 via ORAL
  Filled 2022-09-03 (×3): qty 1

## 2022-09-03 MED ORDER — ONDANSETRON HCL 4 MG/2ML IJ SOLN
INTRAMUSCULAR | Status: DC | PRN
  Administered 2022-09-03: 4 mg via INTRAVENOUS

## 2022-09-03 MED ORDER — CHLOROPROCAINE HCL (PF) 3 % IJ SOLN
INTRAMUSCULAR | Status: DC | PRN
  Administered 2022-09-03: 5 mL via EPIDURAL

## 2022-09-03 MED ORDER — KETOROLAC TROMETHAMINE 30 MG/ML IJ SOLN: 30.0000 mg | Freq: Once | INTRAMUSCULAR | Status: DC | PRN

## 2022-09-03 MED ORDER — SOD CITRATE-CITRIC ACID 500-334 MG/5ML PO SOLN: 30.0000 mL | ORAL | Status: DC | PRN

## 2022-09-03 MED ORDER — PHENYLEPHRINE 80 MCG/ML (10ML) SYRINGE FOR IV PUSH (FOR BLOOD PRESSURE SUPPORT)
80.0000 ug | PREFILLED_SYRINGE | INTRAVENOUS | Status: DC | PRN
  Administered 2022-09-03 (×2): 80 ug via INTRAVENOUS

## 2022-09-03 MED ORDER — FENTANYL-BUPIVACAINE-NACL 0.5-0.125-0.9 MG/250ML-% EP SOLN
12.0000 mL/h | EPIDURAL | Status: DC | PRN
  Administered 2022-09-03: 12 mL/h via EPIDURAL
  Filled 2022-09-03: qty 250

## 2022-09-03 MED ORDER — DEXMEDETOMIDINE HCL IN NACL 80 MCG/20ML IV SOLN: INTRAVENOUS | Status: DC | PRN

## 2022-09-03 MED ORDER — ONDANSETRON HCL 4 MG PO TABS: 4.0000 mg | ORAL_TABLET | ORAL | Status: DC | PRN

## 2022-09-03 MED ORDER — BENZOCAINE-MENTHOL 20-0.5 % EX AERO: 1.0000 | INHALATION_SPRAY | CUTANEOUS | Status: DC | PRN

## 2022-09-03 MED ORDER — OXYCODONE HCL 5 MG/5ML PO SOLN: 5.0000 mg | Freq: Once | ORAL | Status: DC | PRN

## 2022-09-03 MED ORDER — PHENYLEPHRINE 80 MCG/ML (10ML) SYRINGE FOR IV PUSH (FOR BLOOD PRESSURE SUPPORT)
80.0000 ug | PREFILLED_SYRINGE | INTRAVENOUS | Status: DC | PRN
  Filled 2022-09-03: qty 10

## 2022-09-03 MED ORDER — LIDOCAINE HCL (PF) 1 % IJ SOLN: 30.0000 mL | INTRAMUSCULAR | Status: DC | PRN

## 2022-09-03 MED ORDER — DIPHENHYDRAMINE HCL 50 MG/ML IJ SOLN: 12.5000 mg | INTRAMUSCULAR | Status: DC | PRN

## 2022-09-03 MED ORDER — WITCH HAZEL-GLYCERIN EX PADS: 1.0000 | MEDICATED_PAD | CUTANEOUS | Status: DC | PRN

## 2022-09-03 MED ORDER — LACTATED RINGERS IV SOLN: 500.0000 mL | Freq: Once | INTRAVENOUS | Status: DC

## 2022-09-03 MED ORDER — ONDANSETRON HCL 4 MG/2ML IJ SOLN: 4.0000 mg | INTRAMUSCULAR | Status: DC | PRN

## 2022-09-03 SURGICAL SUPPLY — 35 items
APL PRP STRL LF DISP 70% ISPRP (MISCELLANEOUS) ×4
APL SKNCLS STERI-STRIP NONHPOA (GAUZE/BANDAGES/DRESSINGS) ×2
BENZOIN TINCTURE PRP APPL 2/3 (GAUZE/BANDAGES/DRESSINGS) ×2 IMPLANT
CHLORAPREP W/TINT 26 (MISCELLANEOUS) ×4 IMPLANT
CLAMP UMBILICAL CORD (MISCELLANEOUS) ×2 IMPLANT
CLOTH BEACON ORANGE TIMEOUT ST (SAFETY) ×2 IMPLANT
DRSG OPSITE POSTOP 4X10 (GAUZE/BANDAGES/DRESSINGS) ×2 IMPLANT
ELECT REM PT RETURN 9FT ADLT (ELECTROSURGICAL)
ELECTRODE REM PT RTRN 9FT ADLT (ELECTROSURGICAL) ×1 IMPLANT
EXTRACTOR VACUUM M CUP 4 TUBE (SUCTIONS) IMPLANT
GLOVE BIO SURGEON STRL SZ7.5 (GLOVE) ×2 IMPLANT
GLOVE BIOGEL PI IND STRL 7.0 (GLOVE) ×2 IMPLANT
GLOVE BIOGEL PI IND STRL 7.5 (GLOVE) ×2 IMPLANT
GOWN STRL REUS W/TWL LRG LVL3 (GOWN DISPOSABLE) ×4 IMPLANT
KIT ABG SYR 3ML LUER SLIP (SYRINGE) IMPLANT
NDL HYPO 25X5/8 SAFETYGLIDE (NEEDLE) IMPLANT
NEEDLE HYPO 25X5/8 SAFETYGLIDE (NEEDLE) IMPLANT
NS IRRIG 1000ML POUR BTL (IV SOLUTION) ×2 IMPLANT
PACK C SECTION WH (CUSTOM PROCEDURE TRAY) ×2 IMPLANT
PAD OB MATERNITY 4.3X12.25 (PERSONAL CARE ITEMS) ×2 IMPLANT
RTRCTR C-SECT PINK 25CM LRG (MISCELLANEOUS) ×2 IMPLANT
STRIP CLOSURE SKIN 1/2X4 (GAUZE/BANDAGES/DRESSINGS) ×2 IMPLANT
SUT CHROMIC 2 0 CT 1 (SUTURE) ×2 IMPLANT
SUT MNCRL 0 VIOLET CTX 36 (SUTURE) ×2 IMPLANT
SUT MNCRL AB 3-0 PS2 27 (SUTURE) ×2 IMPLANT
SUT MONOCRYL 0 CTX 36 (SUTURE) ×2
SUT PLAIN 2 0 XLH (SUTURE) ×2 IMPLANT
SUT VIC AB 0 CT1 36 (SUTURE) ×2 IMPLANT
SUT VIC AB 0 CTX 36 (SUTURE) ×6
SUT VIC AB 0 CTX36XBRD ANBCTRL (SUTURE) ×6 IMPLANT
SUT VIC AB 2-0 SH 27 (SUTURE)
SUT VIC AB 2-0 SH 27XBRD (SUTURE) IMPLANT
TOWEL OR 17X24 6PK STRL BLUE (TOWEL DISPOSABLE) ×2 IMPLANT
TRAY FOLEY W/BAG SLVR 14FR LF (SET/KITS/TRAYS/PACK) ×2 IMPLANT
WATER STERILE IRR 1000ML POUR (IV SOLUTION) ×2 IMPLANT

## 2022-09-03 NOTE — Anesthesia Preprocedure Evaluation (Signed)
Anesthesia Evaluation  Patient identified by MRN, date of birth, ID band Patient awake    Reviewed: Allergy & Precautions, Patient's Chart, lab work & pertinent test results  Airway Mallampati: II       Dental   Pulmonary Patient abstained from smoking., former smoker   Pulmonary exam normal        Cardiovascular negative cardio ROS Normal cardiovascular exam     Neuro/Psych    GI/Hepatic   Endo/Other    Renal/GU      Musculoskeletal   Abdominal   Peds  Hematology   Anesthesia Other Findings   Reproductive/Obstetrics (+) Pregnancy                              Anesthesia Physical Anesthesia Plan  ASA: 3  Anesthesia Plan: Epidural   Post-op Pain Management:    Induction:   PONV Risk Score and Plan: 0  Airway Management Planned: Natural Airway  Additional Equipment: None  Intra-op Plan:   Post-operative Plan:   Informed Consent: I have reviewed the patients History and Physical, chart, labs and discussed the procedure including the risks, benefits and alternatives for the proposed anesthesia with the patient or authorized representative who has indicated his/her understanding and acceptance.       Plan Discussed with:   Anesthesia Plan Comments: (Lab Results      Component                Value               Date                      WBC                      8.5                 09/03/2022                HGB                      10.3 (L)            09/03/2022                HCT                      32.5 (L)            09/03/2022                MCV                      85.1                09/03/2022                PLT                      152                 09/03/2022           )         Anesthesia Quick Evaluation

## 2022-09-03 NOTE — Op Note (Signed)
Preop Diagnosis: Retained placenta   Postop Diagnosis: Retained Placenta   Procedure: Manual Removal of Retained Placenta   Anesthesia: Regional   Anesthesiologist: Dr. Elgie Congo  Attending: Everett Graff, MD   Assistant: N/A  Findings: Placenta removed intact  Pathology: Placenta  Fluids: 1000 cc  UOP: 150 cc  EBL: 584 cc  Complications: None  Procedure: The patient was taken to the operating room after the risks benefits and alternatives were discussed with the patient, the patient verbalized understanding and consent signed and witnessed.  The patient was placed under MAC anesthesia, prepped and draped in the normal sterile fashion and a time out was performed.  A bivalve speculum was placed in the patient's vagina and attempt at removing placenta with ringed forceps failed.   A weighted speculum was placed in the patient's vagina and after several attempts at removal with ringed forceps the placenta finally gradually was able to be removed intact.  Manual uterine exploration revealed no further remaining products.  All instruments were removed. The count was correct. The patient was transferred to the recovery room in good condition.

## 2022-09-03 NOTE — Transfer of Care (Signed)
Immediate Anesthesia Transfer of Care Note  Patient: Karen Frederick  Procedure(s) Performed: DILATATION AND CURETTAGE (Vagina )  Patient Location: PACU  Anesthesia Type:Epidural  Level of Consciousness: awake, alert , and oriented  Airway & Oxygen Therapy: Patient Spontanous Breathing  Post-op Assessment: Report given to RN and Post -op Vital signs reviewed and stable  Post vital signs: Reviewed and stable  Last Vitals:  Vitals Value Taken Time  BP    Temp    Pulse    Resp    SpO2      Last Pain:  Vitals:   09/03/22 0637  PainSc: 0-No pain         Complications: No notable events documented.

## 2022-09-03 NOTE — Anesthesia Procedure Notes (Signed)
Epidural Patient location during procedure: OB Start time: 09/03/2022 4:27 AM End time: 09/03/2022 4:33 AM  Staffing Anesthesiologist: Effie Berkshire, MD Performed: anesthesiologist   Preanesthetic Checklist Completed: patient identified, IV checked, site marked, risks and benefits discussed, surgical consent, monitors and equipment checked, pre-op evaluation and timeout performed  Epidural Patient position: sitting Prep: DuraPrep Patient monitoring: heart rate, continuous pulse ox and blood pressure Approach: midline Location: L3-L4 Injection technique: LOR saline  Needle:  Needle type: Tuohy  Needle gauge: 17 G Needle length: 9 cm Catheter type: closed end flexible Catheter size: 20 Guage Test dose: negative and 1.5% lidocaine  Assessment Events: blood not aspirated, no cerebrospinal fluid, injection not painful, no injection resistance and no paresthesia  Additional Notes LOR @ 6  Patient identified. Risks/Benefits/Options discussed with patient including but not limited to bleeding, infection, nerve damage, paralysis, failed block, incomplete pain control, headache, blood pressure changes, nausea, vomiting, reactions to medications, itching and postpartum back pain. Confirmed with bedside nurse the patient's most recent platelet count. Confirmed with patient that they are not currently taking any anticoagulation, have any bleeding history or any family history of bleeding disorders. Patient expressed understanding and wished to proceed. All questions were answered. Sterile technique was used throughout the entire procedure. Please see nursing notes for vital signs. Test dose was given through epidural catheter and negative prior to continuing to dose epidural or start infusion. Warning signs of high block given to the patient including shortness of breath, tingling/numbness in hands, complete motor block, or any concerning symptoms with instructions to call for help. Patient was  given instructions on fall risk and not to get out of bed. All questions and concerns addressed with instructions to call with any issues or inadequate analgesia.    Reason for block:procedure for pain

## 2022-09-03 NOTE — Anesthesia Postprocedure Evaluation (Signed)
Anesthesia Post Note  Patient: [name]  Procedure(s) Performed: DILATATION AND CURETTAGE (Vagina )     Patient location during evaluation: PACU Anesthesia Type: Epidural Level of consciousness: awake and alert and oriented Pain management: pain level controlled Vital Signs Assessment: post-procedure vital signs reviewed and stable Respiratory status: spontaneous breathing, nonlabored ventilation and respiratory function stable Cardiovascular status: blood pressure returned to baseline and stable Postop Assessment: no headache, no backache, patient able to bend at knees and epidural receding Anesthetic complications: no   No notable events documented.  Last Vitals:  Vitals:   09/03/22 1015 09/03/22 1030  BP: 115/62 (!) 145/75  Pulse: 63 64  Resp: 15 16  Temp:  36.6 C  SpO2: 97% 100%    Last Pain:  Vitals:   09/03/22 1030  TempSrc:   PainSc: 0-No pain   Pain Goal:    LLE Motor Response: Purposeful movement (09/03/22 1030) LLE Sensation: Tingling (09/03/22 1030) RLE Motor Response: Purposeful movement (09/03/22 1030) RLE Sensation: Tingling (09/03/22 1030)     Epidural/Spinal Function Cutaneous sensation: Able to Discern Pressure (09/03/22 1030), Patient able to flex knees: Yes (09/03/22 1030), Patient able to lift hips off bed: Yes (09/03/22 1030), Back pain beyond tenderness at insertion site: No (09/03/22 1030), Progressively worsening motor and/or sensory loss: No (09/03/22 1030), Bowel and/or bladder incontinence post epidural: No (09/03/22 1030)  Karen Frederick
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
## 2022-09-03 NOTE — Anesthesia Preprocedure Evaluation (Signed)
Anesthesia Evaluation  Patient identified by MRN, date of birth, ID band Patient awake    Reviewed: Allergy & Precautions, NPO status , Patient's Chart, lab work & pertinent test results  Airway Mallampati: II  TM Distance: >3 FB Neck ROM: Full    Dental no notable dental hx.    Pulmonary Patient abstained from smoking., former smoker   Pulmonary exam normal breath sounds clear to auscultation       Cardiovascular negative cardio ROS Normal cardiovascular exam Rhythm:Regular Rate:Normal     Neuro/Psych  PSYCHIATRIC DISORDERS  Depression Bipolar Disorder   negative neurological ROS     GI/Hepatic negative GI ROS, Neg liver ROS,,,  Endo/Other  negative endocrine ROS    Renal/GU negative Renal ROS  negative genitourinary   Musculoskeletal negative musculoskeletal ROS (+)    Abdominal   Peds negative pediatric ROS (+)  Hematology  (+) Blood dyscrasia, anemia Hb 10.3, plt 152   Anesthesia Other Findings   Reproductive/Obstetrics (+) Pregnancy Retained placenta x 2.5h, to OR for D+C                              Anesthesia Physical Anesthesia Plan  ASA: 2  Anesthesia Plan: Epidural   Post-op Pain Management:    Induction:   PONV Risk Score and Plan: 2  Airway Management Planned: Natural Airway  Additional Equipment: None  Intra-op Plan:   Post-operative Plan:   Informed Consent: I have reviewed the patients History and Physical, chart, labs and discussed the procedure including the risks, benefits and alternatives for the proposed anesthesia with the patient or authorized representative who has indicated his/her understanding and acceptance.       Plan Discussed with: CRNA  Anesthesia Plan Comments: (Epidural in place and running from labor )         Anesthesia Quick Evaluation

## 2022-09-03 NOTE — Anesthesia Postprocedure Evaluation (Signed)
Anesthesia Post Note  Patient: Museum/gallery conservator Karen Frederick  Procedure(s) Performed: AN AD HOC LABOR EPIDURAL     Patient location during evaluation: L&D Anesthesia Type: Epidural Level of consciousness: oriented and awake and alert Pain management: pain level controlled Vital Signs Assessment: post-procedure vital signs reviewed and stable Respiratory status: spontaneous breathing and respiratory function stable Cardiovascular status: blood pressure returned to baseline and stable Postop Assessment: no headache and no backache Anesthetic complications: no Comments: Epidural to be used for D+C, retained placenta   No notable events documented.  Last Vitals:  Vitals:   09/03/22 0430 09/03/22 0700  BP: (!) 146/86 (!) 142/86  Pulse: 93 80  Resp:    Temp:    SpO2: 99%     Last Pain:  Vitals:   09/03/22 0637  PainSc: 0-No pain   Pain Goal:                   Pervis Hocking

## 2022-09-03 NOTE — MAU Note (Signed)
.  Karen Frederick is a 27 y.o. at [redacted]w[redacted]d here in MAU reporting: ctx's since 1900. Pt reports they are q5 minutes for the last 1 hour.  Denies LOF and Denies vaginal bleeding Reports +FM  Onset of complaint: today 1900 Pain score: 10/10 abdomen  There were no vitals filed for this visit.   Lab orders placed from triage:   none

## 2022-09-03 NOTE — Lactation Note (Addendum)
This note was copied from a baby's chart. Lactation Consultation Note  Patient Name: [name] DUKGU'R Date: 09/03/2022 Reason for consult: Initial assessment;Early term 37-38.6wks Age:27 hours   P2: Early term infant at 37+4 weeks Feeding preference: Breast/formula  RN requested latch assistance.  Baby was swaddled and asleep in the bassinet when I arrived.  Mother concerned that he has not awakened to feed.  Offered to assist with waking and latching; mother receptive.  Taught hand expression and spoon fed 2 mls of colostrum.  Attempted to latch in the football hold, however, baby was not interested.  Repeated attempts with no latch; reassurance given.  Discussed tummy size and encouraged a lot of STS, breast massage and hand expression.  Suggested mother call her RN/LC for latch assistance as needed.  Asked mother to feed at least every three hours due to gestational age and sooner if baby shows cues.  Mother verbalized understanding.  Father and other visitors present.  RN updated.   Maternal Data Has patient been taught Hand Expression?: Yes  Feeding Mother's Current Feeding Choice: Breast Milk and Formula  LATCH Score Latch: Too sleepy or reluctant, no latch achieved, no sucking elicited.  Audible Swallowing: None  Type of Nipple: Everted at rest and after stimulation  Comfort (Breast/Nipple): Soft / non-tender  Hold (Positioning): Assistance needed to correctly position infant at breast and maintain latch.  LATCH Score: 5   Lactation Tools Discussed/Used    Interventions Interventions: Breast feeding basics reviewed;Assisted with latch;Skin to skin;Breast massage;Hand express;Breast compression;Expressed milk;Position options;Support pillows;Adjust position;Education;LC Services brochure  Discharge Pump: Personal  Consult Status Consult Status: Follow-up Follow-up type: In-patient    Wilton Thrall R [place] Rogacki 09/03/2022, 3:01 PM

## 2022-09-04 LAB — CBC
HCT: 22.2 % — ABNORMAL LOW (ref 36.0–46.0)
Hemoglobin: 7.4 g/dL — ABNORMAL LOW (ref 12.0–15.0)
MCH: 28.1 pg (ref 26.0–34.0)
MCHC: 33.3 g/dL (ref 30.0–36.0)
MCV: 84.4 fL (ref 80.0–100.0)
Platelets: 119 10*3/uL — ABNORMAL LOW (ref 150–400)
RBC: 2.63 MIL/uL — ABNORMAL LOW (ref 3.87–5.11)
RDW: 14.8 % (ref 11.5–15.5)
WBC: 10.5 10*3/uL (ref 4.0–10.5)
nRBC: 0 % (ref 0.0–0.2)

## 2022-09-04 MED ORDER — SODIUM CHLORIDE 0.9 % IV SOLN
500.0000 mg | Freq: Once | INTRAVENOUS | Status: AC
  Administered 2022-09-04: 500 mg via INTRAVENOUS
  Filled 2022-09-04: qty 25

## 2022-09-04 NOTE — Lactation Note (Signed)
This note was copied from a baby's chart. Lactation Consultation Note  Patient Name: Karen Frederick JSHFW'Y Date: 09/04/2022 Reason for consult: Follow-up assessment;Difficult latch;Early term 37-38.6wks;Breastfeeding assistance;Infant weight loss (4.22% WL) Age:27 hours  LC entered the room and the infant was asleep in the bassinet.  Per the birth parent she has not been producing any milk.  LC spoke with the birth parent about supply and demand and milk production.  The birth parent said that she has not been putting the infant to the breast because she is note sure that he is getting anything.  LC encouraged the birth parent to put the infant to the breast.  She stated that she will try to latch the infant at the next feeding because she did not want to supplement.  LC let the birth parent know that she can call for assistance at the next feeding.  She had no further questions or concerns.   Interventions Interventions: Education  Consult Status Consult Status: Follow-up Date: 09/05/22 Follow-up type: In-patient   Elly Modena Takeyla Million 09/04/2022, 6:18 PM
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
## 2022-09-04 NOTE — Progress Notes (Signed)
Post Partum Day 1 s/p NSVD and s/p manual evacuation of placenta  Subjective: up ad lib, voiding, tolerating PO, + flatus, and no BM.  Reports a little dizziness when standing but popped out of bed no problem when I came in the room.  Objective: Blood pressure 128/79, pulse 70, temperature 98.9 F (37.2 C), temperature source Oral, resp. rate 18, last menstrual period 07/23/2021, SpO2 100 %, unknown if currently breastfeeding.  Physical Exam:  General: alert and no distress Lochia: appropriate Uterine Fundus: FF, mildly tender Incision: n/a DVT Evaluation: no calf tenderness  Recent Labs    09/03/22 0247 09/04/22 0508  HGB 10.3* 7.4*  HCT 32.5* 22.2*    Assessment/Plan: Plan for discharge tomorrow and Circumcision prior to discharge Anemia - will order IV venofer and reevaluate after.  Repeat CBC in am. Uterus slightly tender s/p manual evacuation of placenta in OR d/t retained placenta.  Pt s/p abx at that time.  Will cont to observe.  WBC is wnl.   LOS: 1 day   Delice Lesch, MD 09/04/2022, 3:12 PM

## 2022-09-05 ENCOUNTER — Encounter (HOSPITAL_COMMUNITY): Payer: Self-pay | Admitting: Obstetrics and Gynecology

## 2022-09-05 DIAGNOSIS — D509 Iron deficiency anemia, unspecified: Secondary | ICD-10-CM | POA: Diagnosis present

## 2022-09-05 DIAGNOSIS — D62 Acute posthemorrhagic anemia: Secondary | ICD-10-CM | POA: Diagnosis not present

## 2022-09-05 HISTORY — PX: NEXPLANON TRAY: NUR84248

## 2022-09-05 LAB — CBC
HCT: 25 % — ABNORMAL LOW (ref 36.0–46.0)
Hemoglobin: 7.8 g/dL — ABNORMAL LOW (ref 12.0–15.0)
MCH: 26.9 pg (ref 26.0–34.0)
MCHC: 31.2 g/dL (ref 30.0–36.0)
MCV: 86.2 fL (ref 80.0–100.0)
Platelets: 144 10*3/uL — ABNORMAL LOW (ref 150–400)
RBC: 2.9 MIL/uL — ABNORMAL LOW (ref 3.87–5.11)
RDW: 15 % (ref 11.5–15.5)
WBC: 8.8 10*3/uL (ref 4.0–10.5)
nRBC: 0.2 % (ref 0.0–0.2)

## 2022-09-05 MED ORDER — ACETAMINOPHEN 325 MG PO TABS: 650.0000 mg | ORAL_TABLET | ORAL | Status: DC | PRN

## 2022-09-05 MED ORDER — ETONOGESTREL 68 MG ~~LOC~~ IMPL
68.0000 mg | DRUG_IMPLANT | Freq: Once | SUBCUTANEOUS | Status: AC
  Administered 2022-09-05: 68 mg via SUBCUTANEOUS
  Filled 2022-09-05: qty 1

## 2022-09-05 MED ORDER — LIDOCAINE HCL 1 % IJ SOLN
0.0000 mL | Freq: Once | INTRAMUSCULAR | Status: AC | PRN
  Administered 2022-09-05: 20 mL via INTRADERMAL
  Filled 2022-09-05: qty 20

## 2022-09-05 MED ORDER — IBUPROFEN 600 MG PO TABS: 600.0000 mg | ORAL_TABLET | Freq: Four times a day (QID) | ORAL | 0 refills | Status: DC

## 2022-09-05 NOTE — Procedures (Signed)
Nexplanon insertion  Patient positioned supine w/ HOB elevated 30 degrees, left arm extended, site above tricep prepped with betadine, and 81mL of 1% buffered lidocaine used to anesthetize site.  Nexplanon inserted per protocol w/ caution to avoid sulcus. Pt tolerated the procedure well. Hemostasis noted at the end of procedure.  Nexplanon palpated by myself and the patient. Insertion card completed and copy provided to pt. Pt to have implant removed in 3 yrs.    Burman Foster, DNP, CNM 09/05/2022 2:10 PM

## 2022-09-05 NOTE — Lactation Note (Signed)
This note was copied from a baby's chart. Lactation Consultation Note  Patient Name: Boy Gloriann Riede TIWPY'K Date: 09/05/2022   Age:27 hours   Feeding Nipple Type: Slow - flow Feeding baby formula. Infant taking up to 45 ml/ feeding  Mother is ready for discharge. She reports she is pleased with how well baby is doing with formula feeding by bottle. She reports she has been educated about engorgement. No questions or concerns.  Stana Bunting M 09/05/2022, 2:16 PM

## 2022-09-05 NOTE — Discharge Summary (Signed)
Postpartum Discharge Summary  Date of Service updated 09/05/22    Patient Name: Karen Frederick DOB: 1996-08-01 MRN: [id number]  Date of admission: 09/03/2022 Delivery date:09/03/2022  Delivering provider: Burman Foster B  Date of discharge: 09/05/2022  Admitting diagnosis: Normal labor [O80, Z37.9] Intrauterine pregnancy: [redacted]w[redacted]d    Secondary diagnosis:  Principal Problem:   Normal labor Active Problems:   Acute blood loss anemia (ABLA)   IDA (iron deficiency anemia)  Additional problems: Obesity, Depression, Bipolar disorder    Discharge diagnosis: Term Pregnancy Delivered                                              Post partum procedures: retained placenta with manual removal and uterine exploration in the operating room under MAC anesthesia  Augmentation: N/A Complications: retained placenta  Hospital course: Onset of Labor With Vaginal Delivery      27y.o. yo [id number] admitted in Active Labor on 09/03/2022. Precipitous labor course was uncomplicated Membrane Rupture Time/Date: 5:55 AM ,09/03/2022   Delivery Method:Vaginal, Spontaneous  Episiotomy: None  Lacerations:   None Patient had a postpartum course complicated by retained placenta with manual removal, uterine exploration, and antibiotics x24 hours. Pt has remained afebrile with no signs of intra abdominal infection. Admitted with anemia and diagnosed with acute blood loss anemia after delivery. Asymptomatic anemia treated with IV Venofer She is ambulating, tolerating a regular diet, passing flatus, and urinating well. Patient is discharged home in stable condition on 09/05/22.  Newborn Data: Birth date:09/03/2022  Birth time:6:09 AM  Gender:Female  Living status:Living  Apgars:8 ,9  Weight:2892 g   Magnesium Sulfate received: No BMZ received: No Rhophylac:N/A MMR:N/A T-DaP: declined Flu: No Transfusion:No  Physical exam  Vitals:   09/03/22 2036 09/04/22 0041 09/04/22 2035 09/05/22 0521  BP:  131/74 128/79 120/83 124/84  Pulse: 68 70 78 80  Resp: 18 18 18 18   Temp: 98.6 F (37 C) 98.9 F (37.2 C) 98.6 F (37 C) 98.3 F (36.8 C)  TempSrc: Oral Oral Oral Oral  SpO2: 100% 100% 100% 100%   General: alert, cooperative, and no distress Lochia: appropriate Uterine Fundus: firm Perineum: intact, healing DVT Evaluation: No evidence of DVT seen on physical exam. No cords or calf tenderness. No significant calf/ankle edema. Labs: Lab Results  Component Value Date   WBC 8.8 09/05/2022   HGB 7.8 (L) 09/05/2022   HCT 25.0 (L) 09/05/2022   MCV 86.2 09/05/2022   PLT 144 (L) 09/05/2022      Latest Ref Rng & Units 08/02/2022    7:39 AM  CMP  Glucose 70 - 99 mg/dL 102   BUN 6 - 20 mg/dL 5   Creatinine 0.44 - 1.00 mg/dL 0.50   Sodium 135 - 145 mmol/L 139   Potassium 3.5 - 5.1 mmol/L 3.8   Chloride 98 - 111 mmol/L 107   CO2 22 - 32 mmol/L 23   Calcium 8.9 - 10.3 mg/dL 8.2   Total Protein 6.5 - 8.1 g/dL 6.5   Total Bilirubin 0.3 - 1.2 mg/dL 0.5   Alkaline Phos 38 - 126 U/L 124   AST 15 - 41 U/L 23   ALT 0 - 44 U/L 22    Edinburgh Score:    09/05/2022   11:45 AM  Edinburgh Postnatal Depression Scale Screening Tool  I have  been able to laugh and see the funny side of things. 0  I have looked forward with enjoyment to things. 1  I have blamed myself unnecessarily when things went wrong. 2  I have been anxious or worried for no good reason. 1  I have felt scared or panicky for no good reason. 0  Things have been getting on top of me. 0  I have been so unhappy that I have had difficulty sleeping. 0  I have felt sad or miserable. 2  I have been so unhappy that I have been crying. 0  The thought of harming myself has occurred to me. 0  Edinburgh Postnatal Depression Scale Total 6      After visit meds:  Allergies as of 09/05/2022   No Known Allergies      Medication List     TAKE these medications    acetaminophen 325 MG tablet Commonly known as:  Tylenol Take 2 tablets (650 mg total) by mouth every 4 (four) hours as needed (for pain scale < 4).   cyclobenzaprine 5 MG tablet Commonly known as: FLEXERIL Take 1 tablet (5 mg total) by mouth 3 (three) times daily as needed for muscle spasms (body aches).   ibuprofen 600 MG tablet Commonly known as: ADVIL Take 1 tablet (600 mg total) by mouth every 6 (six) hours.   loperamide 2 MG capsule Commonly known as: IMODIUM Take 1 capsule (2 mg total) by mouth 4 (four) times daily as needed for diarrhea or loose stools.   multivitamin-prenatal 27-0.8 MG Tabs tablet Take 1 tablet by mouth daily at 12 noon.   ondansetron 4 MG disintegrating tablet Commonly known as: ZOFRAN-ODT Take 1 tablet (4 mg total) by mouth every 6 (six) hours as needed for nausea.   promethazine 25 MG tablet Commonly known as: PHENERGAN Take 25 mg by mouth every 6 (six) hours as needed for nausea/vomiting.         Discharge home in stable condition Infant Feeding: Breast and formula Infant Disposition:home with mother Discharge instruction: per After Visit Summary and Postpartum booklet. Activity: Advance as tolerated. Pelvic rest for 6 weeks.  Diet: routine diet Anticipated Birth Control: PP Nexplanon placed Postpartum Appointment:6 weeks Additional Postpartum F/U:  none Future Appointments:No future appointments. Follow up Visit:  [facility]. Schedule an appointment as soon as possible for a visit in 6 week(s).   Specialty: Obstetrics and Gynecology Contact information: [address] [place] [address] [phone]                    09/05/2022 Arrie Eastern, CNM

## 2022-09-06 LAB — SURGICAL PATHOLOGY

## 2022-09-12 ENCOUNTER — Telehealth (HOSPITAL_COMMUNITY): Payer: Self-pay | Admitting: *Deleted

## 2022-09-12 NOTE — Telephone Encounter (Signed)
Mom reports feeling well, but holding back from pushing with BM's because she fears it will hurt. No hard stools reported. Recommended she use dermaplast before BM's and drink lots of fluids, eat lots of leafy veggies & fruits with peel. Suggested she talk with OB if it continues. No concerns about herself at this time. EPDS=12 [facility] score=6) Reports she is taking meds for anxiety and has spoken with OB regarding therapist.  Mom reports baby is doing well. Feeding, peeing, and pooping without difficulty. Safe sleep reviewed. Mom reports no concerns about baby at present.  Odis Hollingshead, RN 09-12-2022 at 4;25pm

## 2022-09-22 DIAGNOSIS — Z419 Encounter for procedure for purposes other than remedying health state, unspecified: Secondary | ICD-10-CM | POA: Diagnosis not present

## 2022-10-21 DIAGNOSIS — Z419 Encounter for procedure for purposes other than remedying health state, unspecified: Secondary | ICD-10-CM | POA: Diagnosis not present

## 2022-11-21 DIAGNOSIS — Z419 Encounter for procedure for purposes other than remedying health state, unspecified: Secondary | ICD-10-CM | POA: Diagnosis not present

## 2022-12-21 DIAGNOSIS — Z419 Encounter for procedure for purposes other than remedying health state, unspecified: Secondary | ICD-10-CM | POA: Diagnosis not present

## 2023-01-21 DIAGNOSIS — Z419 Encounter for procedure for purposes other than remedying health state, unspecified: Secondary | ICD-10-CM | POA: Diagnosis not present

## 2023-02-01 DIAGNOSIS — Z113 Encounter for screening for infections with a predominantly sexual mode of transmission: Secondary | ICD-10-CM | POA: Diagnosis not present

## 2023-02-01 DIAGNOSIS — N760 Acute vaginitis: Secondary | ICD-10-CM | POA: Diagnosis not present

## 2023-02-01 DIAGNOSIS — Z114 Encounter for screening for human immunodeficiency virus [HIV]: Secondary | ICD-10-CM | POA: Diagnosis not present

## 2023-02-20 DIAGNOSIS — Z419 Encounter for procedure for purposes other than remedying health state, unspecified: Secondary | ICD-10-CM | POA: Diagnosis not present

## 2023-03-23 DIAGNOSIS — Z419 Encounter for procedure for purposes other than remedying health state, unspecified: Secondary | ICD-10-CM | POA: Diagnosis not present

## 2023-04-23 DIAGNOSIS — Z419 Encounter for procedure for purposes other than remedying health state, unspecified: Secondary | ICD-10-CM | POA: Diagnosis not present

## 2023-05-23 DIAGNOSIS — Z419 Encounter for procedure for purposes other than remedying health state, unspecified: Secondary | ICD-10-CM | POA: Diagnosis not present

## 2023-06-16 DIAGNOSIS — N760 Acute vaginitis: Secondary | ICD-10-CM | POA: Diagnosis not present

## 2023-06-23 DIAGNOSIS — Z419 Encounter for procedure for purposes other than remedying health state, unspecified: Secondary | ICD-10-CM | POA: Diagnosis not present

## 2023-07-23 DIAGNOSIS — Z419 Encounter for procedure for purposes other than remedying health state, unspecified: Secondary | ICD-10-CM | POA: Diagnosis not present

## 2023-08-23 DIAGNOSIS — Z419 Encounter for procedure for purposes other than remedying health state, unspecified: Secondary | ICD-10-CM | POA: Diagnosis not present

## 2023-09-23 DIAGNOSIS — Z419 Encounter for procedure for purposes other than remedying health state, unspecified: Secondary | ICD-10-CM | POA: Diagnosis not present

## 2024-01-22 DIAGNOSIS — J020 Streptococcal pharyngitis: Secondary | ICD-10-CM | POA: Diagnosis not present

## 2024-01-23 DIAGNOSIS — R051 Acute cough: Secondary | ICD-10-CM | POA: Diagnosis not present

## 2024-01-23 DIAGNOSIS — J029 Acute pharyngitis, unspecified: Secondary | ICD-10-CM | POA: Diagnosis not present

## 2024-02-01 DIAGNOSIS — Z419 Encounter for procedure for purposes other than remedying health state, unspecified: Secondary | ICD-10-CM | POA: Diagnosis not present

## 2024-03-02 DIAGNOSIS — Z419 Encounter for procedure for purposes other than remedying health state, unspecified: Secondary | ICD-10-CM | POA: Diagnosis not present

## 2024-03-13 DIAGNOSIS — Z113 Encounter for screening for infections with a predominantly sexual mode of transmission: Secondary | ICD-10-CM | POA: Diagnosis not present

## 2024-03-13 DIAGNOSIS — R87810 Cervical high risk human papillomavirus (HPV) DNA test positive: Secondary | ICD-10-CM | POA: Diagnosis not present

## 2024-03-13 DIAGNOSIS — N870 Mild cervical dysplasia: Secondary | ICD-10-CM | POA: Diagnosis not present

## 2024-03-13 DIAGNOSIS — R7303 Prediabetes: Secondary | ICD-10-CM | POA: Diagnosis not present

## 2024-03-13 DIAGNOSIS — Z0000 Encounter for general adult medical examination without abnormal findings: Secondary | ICD-10-CM | POA: Diagnosis not present

## 2024-03-26 ENCOUNTER — Telehealth (INDEPENDENT_AMBULATORY_CARE_PROVIDER_SITE_OTHER): Payer: Self-pay | Admitting: Primary Care

## 2024-03-26 NOTE — Telephone Encounter (Signed)
 Called pt to confirm appt. Pt will be present.

## 2024-03-27 ENCOUNTER — Ambulatory Visit (INDEPENDENT_AMBULATORY_CARE_PROVIDER_SITE_OTHER): Admitting: Primary Care

## 2024-04-01 ENCOUNTER — Ambulatory Visit (INDEPENDENT_AMBULATORY_CARE_PROVIDER_SITE_OTHER): Admitting: Primary Care

## 2024-04-02 DIAGNOSIS — Z419 Encounter for procedure for purposes other than remedying health state, unspecified: Secondary | ICD-10-CM | POA: Diagnosis not present

## 2024-05-03 DIAGNOSIS — Z419 Encounter for procedure for purposes other than remedying health state, unspecified: Secondary | ICD-10-CM | POA: Diagnosis not present

## 2024-06-17 ENCOUNTER — Ambulatory Visit: Admitting: Adult Health

## 2024-06-17 ENCOUNTER — Encounter (INDEPENDENT_AMBULATORY_CARE_PROVIDER_SITE_OTHER): Payer: Self-pay | Admitting: Primary Care

## 2024-06-17 ENCOUNTER — Encounter: Payer: Self-pay | Admitting: Adult Health

## 2024-06-17 ENCOUNTER — Ambulatory Visit (INDEPENDENT_AMBULATORY_CARE_PROVIDER_SITE_OTHER): Admitting: Primary Care

## 2024-06-17 VITALS — BP 120/79 | HR 86 | Resp 16 | Ht 67.0 in | Wt 260.0 lb

## 2024-06-17 DIAGNOSIS — N939 Abnormal uterine and vaginal bleeding, unspecified: Secondary | ICD-10-CM | POA: Diagnosis not present

## 2024-06-17 DIAGNOSIS — F418 Other specified anxiety disorders: Secondary | ICD-10-CM | POA: Diagnosis not present

## 2024-06-17 DIAGNOSIS — E782 Mixed hyperlipidemia: Secondary | ICD-10-CM | POA: Diagnosis not present

## 2024-06-17 DIAGNOSIS — Z7689 Persons encountering health services in other specified circumstances: Secondary | ICD-10-CM

## 2024-06-17 DIAGNOSIS — N921 Excessive and frequent menstruation with irregular cycle: Secondary | ICD-10-CM

## 2024-06-17 DIAGNOSIS — G4733 Obstructive sleep apnea (adult) (pediatric): Secondary | ICD-10-CM | POA: Diagnosis not present

## 2024-06-17 DIAGNOSIS — E1165 Type 2 diabetes mellitus with hyperglycemia: Secondary | ICD-10-CM

## 2024-06-17 DIAGNOSIS — Z975 Presence of (intrauterine) contraceptive device: Secondary | ICD-10-CM

## 2024-06-17 DIAGNOSIS — F419 Anxiety disorder, unspecified: Secondary | ICD-10-CM

## 2024-06-17 MED ORDER — ATORVASTATIN CALCIUM 20 MG PO TABS: 20.0000 mg | ORAL_TABLET | Freq: Every day | ORAL | 3 refills | Status: AC

## 2024-06-17 NOTE — Patient Instructions (Signed)
 Diabetes: Carbohydrate Counting for Adults Carbohydrate counting is a method of keeping track of how many carbohydrates you eat. Eating carbohydrates increases the amount of sugar, also called glucose, in your blood. By counting how many carbohydrates you eat, you can improve how well you manage your blood sugar. This, in turn, helps you manage your diabetes. Carbohydrates are measured in grams (g) per serving. It's important to know how many carbohydrates (in grams or by serving size) you can have in each meal. This is different for every person. A dietitian can help you make a meal plan and calculate how many carbohydrates you should have at each meal and snack. What foods contain carbohydrates? Carbohydrates are found in these foods: Grains, such as breads and cereals. Dried beans and soy products. Starchy vegetables, such as potatoes, peas, and corn. Fruit and fruit juices. Milk and yogurt. Sweets and snack foods like cake, cookies, candy, chips, and soft drinks. How do I count carbohydrates in foods? There are two ways to count carbohydrates in food. You can read food labels or learn standard serving sizes of foods. You can use either of these methods or a combination of both. Using the Nutrition Facts label The Nutrition Facts list is included on the labels of almost all packaged foods and drinks in the [place] It includes: The serving size. Information about nutrients in each serving. This includes the grams of carbohydrate per serving. To use the Nutrition Facts, decide how many servings you will have. Then, multiply the number of servings by the number of carbohydrates per serving. The resulting number is the total grams of carbohydrates that you'll be having. Learning the standard serving sizes of foods When you eat carbohydrate foods that aren't packaged or don't include Nutrition Facts on the label, you need to measure the servings in order to count the grams of carbohydrates. Measure  the foods that you'll eat with a food scale or measuring cup, if needed. Decide how many standard-size servings you'll eat. Multiply the number of servings by 15. For foods that contain carbohydrates, one serving equals 15 g of carbohydrates. For example, if you eat 2 cups or 10 oz (300 g) of strawberries, you'll have eaten 2 servings and 30 g of carbohydrates (2 servings x 15 g = 30 g). For foods that have more than one food mixed, such as soups and casseroles, you must count the carbohydrates in each food that's included. Here's a list of standard serving sizes for common carbohydrate-rich foods. Each of these servings has about 15 g of carbohydrates: 1 slice of bread. 1 six-inch (15 cm) tortilla. ? cup or 2 oz (53 g) of cooked rice or pasta.  cup or 3 oz (85 g) of cooked or canned, drained, and rinsed beans or lentils.  cup or 3 oz (85 g) of a starchy vegetable, such as peas, corn, or squash.  cup or 4 oz (120 g) of hot cereal.  cup or 3 oz (85 g) of boiled or mashed potatoes, or  or 3 oz (85 g) of a large baked potato.  cup or 4 fl oz (118 mL) of fruit juice. 1 cup or 8 fl oz (237 mL) of milk. 1 small or 4 oz (106 g) apple.  or 2 oz (63 g) of a medium banana. 1 cup or 5 oz (150 g) of strawberries. 3 cups or 1 oz (28.3 g) of popped popcorn. What is an example of carbohydrate counting? To calculate the grams of carbohydrates in this sample  meal, follow the steps below. Sample meal 3 oz (85 g) chicken breast. ? cup or 4 oz (106 g) of brown rice.  cup or 3 oz (85 g) of corn. 1 cup or 8 fl oz (237 mL) of milk. 1 cup or 5 oz (150 g) of strawberries with sugar-free whipped topping. Carbohydrate calculation Identify the foods that have carbohydrates: Rice. Corn. Milk. Strawberries. Calculate how many servings you have of each food: 2 servings of rice. 1 serving of corn. 1 serving of milk. 1 serving of strawberries. Multiply each number of servings by 15 g: 2 servings of  rice x 15 g = 30 g. 1 serving of corn x 15 g = 15 g. 1 serving of milk x 15 g = 15 g. 1 serving of strawberries x 15 g = 15 g. Add together all of the amounts to find the total grams of carbohydrates eaten: 30 g + 15 g + 15 g + 15 g = 75 g of carbohydrates total. Where to find more information To learn more, go to: American Diabetes Association at diabetes.org. Click Search and type carb counting. Find the link you need. Centers for Disease Control and Prevention at tonerpromos.no. Click Search and type diabetes. Find the link you need. Academy of Nutrition and Dietetics: eatright.org This information is not intended to replace advice given to you by your health care provider. Make sure you discuss any questions you have with your health care provider. Document Revised: 07/26/2023 Document Reviewed: 07/26/2023 Elsevier Patient Education  2025 [employer].Dyslipidemia Dyslipidemia is an imbalance of waxy, fat-like substances (lipids) in the blood. The body needs lipids in small amounts. Dyslipidemia often involves a high level of cholesterol or triglycerides, which are types of lipids. Common forms of dyslipidemia include: High levels of LDL cholesterol. LDL is the type of cholesterol that causes fatty deposits (plaques) to build up in the blood vessels that carry blood away from the heart (arteries). Low levels of HDL cholesterol. HDL cholesterol is the type of cholesterol that protects against heart disease. High levels of HDL remove the LDL buildup from arteries. High levels of triglycerides. Triglycerides are a fatty substance in the blood that is linked to a buildup of plaques in the arteries. What are the causes? There are two main types of dyslipidemia: primary and secondary. Primary dyslipidemia is caused by changes (mutations) in genes that are passed down through families (inherited). These mutations cause several types of dyslipidemia. Secondary dyslipidemia may be caused by  various risk factors that can lead to the disease, such as lifestyle choices and certain medical conditions. What increases the risk? You are more likely to develop this condition if you are an older man or if you are a woman who has gone through menopause. Other risk factors include: Having a family history of dyslipidemia. Taking certain medicines, including birth control pills, steroids, some diuretics, and beta-blockers. Eating a diet high in saturated fat. Smoking cigarettes or excessive alcohol intake. Having certain medical conditions such as diabetes, polycystic ovary syndrome (PCOS), kidney disease, liver disease, or hypothyroidism. Not exercising regularly. Being overweight or obese with too much belly fat. What are the signs or symptoms? In most cases, dyslipidemia does not usually cause any symptoms. In severe cases, very high lipid levels can cause: Fatty bumps under the skin (xanthomas). A white or gray ring around the black center (pupil) of the eye. Very high triglyceride levels can cause inflammation of the pancreas (pancreatitis). How is this diagnosed? Your health care  provider may diagnose dyslipidemia based on a routine blood test (fasting blood test). Because most people do not have symptoms of the condition, this blood testing (lipid profile) is done on adults age 66 and older and is repeated every 4-6 years. This test checks: Total cholesterol. This measures the total amount of cholesterol in your blood, including LDL cholesterol, HDL cholesterol, and triglycerides. A healthy number is below 200 mg/dL (4.82 mmol/L). LDL cholesterol. The target number for LDL cholesterol is different for each person, depending on individual risk factors. A healthy number is usually below 100 mg/dL (7.40 mmol/L). Ask your health care provider what your LDL cholesterol should be. HDL cholesterol. An HDL level of 60 mg/dL (8.44 mmol/L) or higher is best because it helps to protect against  heart disease. A number below 40 mg/dL (8.96 mmol/L) for men or below 50 mg/dL (8.70 mmol/L) for women increases the risk for heart disease. Triglycerides. A healthy triglyceride number is below 150 mg/dL (8.30 mmol/L). If your lipid profile is abnormal, your health care provider may do other blood tests. How is this treated? Treatment depends on the type of dyslipidemia that you have and your other risk factors for heart disease and stroke. Your health care provider will have a target range for your lipid levels based on this information. Treatment for dyslipidemia starts with lifestyle changes, such as diet and exercise. Your health care provider may recommend that you: Get regular exercise. Make changes to your diet. Quit smoking if you smoke. Limit your alcohol intake. If diet changes and exercise do not help you reach your goals, your health care provider may also prescribe medicine to lower lipids. The most commonly prescribed type of medicine lowers your LDL cholesterol (statin drug). If you have a high triglyceride level, your provider may prescribe another type of drug (fibrate) or an omega-3 fish oil supplement, or both. Follow these instructions at home: Eating and drinking  Follow instructions from your health care provider or dietitian about eating or drinking restrictions. Eat a healthy diet as told by your health care provider. This can help you reach and maintain a healthy weight, lower your LDL cholesterol, and raise your HDL cholesterol. This may include: Limiting your calories, if you are overweight. Eating more fruits, vegetables, whole grains, fish, and lean meats. Limiting saturated fat, trans fat, and cholesterol. Do not drink alcohol if: Your health care provider tells you not to drink. You are pregnant, may be pregnant, or are planning to become pregnant. If you drink alcohol: Limit how much you have to: 0-1 drink a day for women. 0-2 drinks a day for men. Know how  much alcohol is in your drink. In the [place] one drink equals one 12 oz bottle of beer (355 mL), one 5 oz glass of wine (148 mL), or one 1 oz glass of hard liquor (44 mL). Activity Get regular exercise. Start an exercise and strength training program as told by your health care provider. Ask your health care provider what activities are safe for you. Your health care provider may recommend: 30 minutes of aerobic activity 4-6 days a week. Brisk walking is an example of aerobic activity. Strength training 2 days a week. General instructions Do not use any products that contain nicotine or tobacco. These products include cigarettes, chewing tobacco, and vaping devices, such as e-cigarettes. If you need help quitting, ask your health care provider. Take over-the-counter and prescription medicines only as told by your health care provider. This includes supplements. Keep  all follow-up visits. This is important. Contact a health care provider if: You are having trouble sticking to your exercise or diet plan. You are struggling to quit smoking or to control your use of alcohol. Summary Dyslipidemia often involves a high level of cholesterol or triglycerides, which are types of lipids. Treatment depends on the type of dyslipidemia that you have and your other risk factors for heart disease and stroke. Treatment for dyslipidemia starts with lifestyle changes, such as diet and exercise. Your health care provider may prescribe medicine to lower lipids. This information is not intended to replace advice given to you by your health care provider. Make sure you discuss any questions you have with your health care provider. Document Revised: 03/11/2022 Document Reviewed: 10/12/2020 Elsevier Patient Education  2025 [employer].

## 2024-06-17 NOTE — Progress Notes (Signed)
 Vitamin D  Subjective:   Karen Frederick is a 28 y.o. morbid obese female who presents for establish care.  She has several concerns that she would like to address today her weight which is causing social anxiety, depression and lack of self-confidence.  She admits to trying different diets and exercising with no productive weight loss just frustration and exhaustion.  She also voices concern about a rash that appeared on her right leg 6 months and has not improved and she has tried over-the-counter treatment triamcinolone , cortisone and other over-the-counter medication but she is unable to recall the name.  Also she denies trauma, unaware of a insect bite. She has a 56-year-old 56-year-old and is not breast-feeding.   She has a history of snoring but unsure if she still does . Asked her to partner if she snores. With ness sleep apnea. Patient presents with possible obstructive sleep apnea. Patent has a 3 years history of symptoms of daytime fatigue, morning fatigue, and morning headache. Patient generally gets 3 or 4 hours of sleep per night, and states they generally have difficulty falling asleep, nightime awakenings, and difficulty falling back asleep if awakened. Snoring of moderate severity is present. Apneic episodes is present. Nasal obstruction is not present.  Patient has not had tonsillectomy.    Advised patient we will be doing labs today she recently had labs done on 03/13/2024 kidney liver function normal no signs of anemia elevated triglycerides low HDLs and elevated LDLs.  She is also a diabetic A1c 6.5.  Explained to patient prediabetes is 5.7-6.4.  Medication will not be started today but will address cholesterol.  Patient would like to try weight loss and diet modification before being on medication for diabetes.  Thyroid  levels are normal. No Known Allergies  No current outpatient medications on file prior to visit.   No current facility-administered medications on file prior to visit.     Review of System: ROS Comprehensive ROS Pertinent positive and negative noted in HPI   Objective:  BP 120/79   Pulse 86   Resp 16   Ht 5' 7 (1.702 m)   Wt 260 lb (117.9 kg)   SpO2 99%   Breastfeeding No   BMI 40.72 kg/m   Filed Weights   06/17/24 0953  Weight: 260 lb (117.9 kg)    Physical Exam Vitals reviewed.  Constitutional:      Appearance: Normal appearance. She is obese.     Comments: Morbid   HENT:     Head: Normocephalic.     Right Ear: Tympanic membrane, ear canal and external ear normal.     Left Ear: Tympanic membrane, ear canal and external ear normal.     Nose: Nose normal.     Mouth/Throat:     Mouth: Mucous membranes are moist.  Eyes:     Extraocular Movements: Extraocular movements intact.     Pupils: Pupils are equal, round, and reactive to light.  Cardiovascular:     Rate and Rhythm: Normal rate and regular rhythm.  Pulmonary:     Effort: Pulmonary effort is normal.     Breath sounds: Normal breath sounds.  Abdominal:     General: Bowel sounds are normal. There is distension.     Palpations: Abdomen is soft.  Musculoskeletal:        General: Normal range of motion.     Cervical back: Normal range of motion and neck supple.  Skin:    General: Skin is warm and dry.  Neurological:     Mental Status: She is alert and oriented to person, place, and time.  Psychiatric:        Mood and Affect: Mood normal.        Behavior: Behavior normal.        Thought Content: Thought content normal.        Judgment: Judgment normal.      Assessment:  Karen Frederick was seen today for new patient (initial visit).  Diagnoses and all orders for this visit:  Encounter to establish care  Abnormal uterine bleeding (AUB) 2/2 Nexplanon  in place GYN managing by GYN plans    Menorrhagia with irregular cycle Painful irregular menorrhagia -     CBC with Differential -     TSH + free T4  Morbid obesity (HCC) Morbid Obesity is > indicating an excess in  caloric intake or underlining conditions. This may lead to other co-morbidities. Educated on lifestyle modifications of diet and exercise which may reduce obesity.   Patient requesting [facility] management with [facility]  Referral made appointment given to patient. -     CMP14+EGFR -     Lipid Panel -     TSH + free T4  Anxiety and depression -     TSH + free T4  OSA (obstructive sleep apnea) -     Ambulatory referral to Pulmonology Scheduled appt with LP for OSA today at 1 pm   Type 2 diabetes mellitus with hyperglycemia, without long-term current use of insulin (HCC) - educated on lifestyle modifications, including but not limited to diet choices and adding exercise to daily routine.    Mixed hyperlipidemia -     atorvastatin (LIPITOR) 20 MG tablet; Take 1 tablet (20 mg total) by mouth daily.       This note has been created with Education officer, environmental. Any transcriptional errors are unintentional.   Return in about 4 weeks (around 07/15/2024) for weigh management .  Karen SHAUNNA Bohr, NP 06/17/2024, 10:34 AM

## 2024-06-22 DIAGNOSIS — E782 Mixed hyperlipidemia: Secondary | ICD-10-CM | POA: Diagnosis not present

## 2024-06-22 DIAGNOSIS — Z6841 Body Mass Index (BMI) 40.0 and over, adult: Secondary | ICD-10-CM | POA: Diagnosis not present

## 2024-06-22 DIAGNOSIS — E119 Type 2 diabetes mellitus without complications: Secondary | ICD-10-CM | POA: Diagnosis not present

## 2024-06-22 DIAGNOSIS — E66813 Obesity, class 3: Secondary | ICD-10-CM | POA: Diagnosis not present

## 2024-06-22 DIAGNOSIS — R5383 Other fatigue: Secondary | ICD-10-CM | POA: Diagnosis not present

## 2024-07-03 DIAGNOSIS — Z419 Encounter for procedure for purposes other than remedying health state, unspecified: Secondary | ICD-10-CM | POA: Diagnosis not present

## 2024-07-15 ENCOUNTER — Telehealth (INDEPENDENT_AMBULATORY_CARE_PROVIDER_SITE_OTHER): Payer: Self-pay | Admitting: Primary Care

## 2024-07-15 NOTE — Telephone Encounter (Signed)
Pt will be present.

## 2024-07-16 ENCOUNTER — Other Ambulatory Visit (HOSPITAL_COMMUNITY)
Admission: RE | Admit: 2024-07-16 | Discharge: 2024-07-16 | Disposition: A | Discharge status: Home / Self Care | Source: Ambulatory Visit | Attending: Primary Care | Admitting: Primary Care

## 2024-07-16 ENCOUNTER — Encounter (INDEPENDENT_AMBULATORY_CARE_PROVIDER_SITE_OTHER): Payer: Self-pay | Admitting: Primary Care

## 2024-07-16 ENCOUNTER — Ambulatory Visit (INDEPENDENT_AMBULATORY_CARE_PROVIDER_SITE_OTHER): Admitting: Primary Care

## 2024-07-16 VITALS — BP 139/87 | HR 100 | Resp 16 | Wt 253.2 lb

## 2024-07-16 DIAGNOSIS — N898 Other specified noninflammatory disorders of vagina: Secondary | ICD-10-CM | POA: Insufficient documentation

## 2024-07-16 NOTE — Progress Notes (Signed)
 [facility]  Karen Frederick Frederick, is a 28 y.o. female  [phone]  FMW:[id number]  DOB - 03-30-1996  Chief Complaint  Patient presents with   Vaginal Discharge       Subjective:   Karen Frederick Frederick is a 28 y.o. female here today for an acute visit. Vaginal discharge . Voices no other problems and concerns. Pleased with weight loss managed outside of practice.  Vaginal Discharge The patient's primary symptoms include vaginal discharge.    No problems updated.  Comprehensive ROS Pertinent positive and negative noted in HPI   No Known Allergies  Past Medical History:  Diagnosis Date   Anemia    with pregnacy   Bipolar depression (HCC)    dx age 73   Depression    Dyspnea    history, no current problem   Gonorrhea    Neuromuscular disorder (HCC)    patient denies this dx on 09/24/21   Trichomonal vaginitis    Vaginal Pap smear, abnormal     Current Outpatient Medications on File Prior to Visit  Medication Sig Dispense Refill   atorvastatin  (LIPITOR) 20 MG tablet Take 1 tablet (20 mg total) by mouth daily. 90 tablet 3   cephALEXin  (KEFLEX ) 500 MG capsule Take 500 mg by mouth 4 (four) times daily.     Cholecalciferol 1.25 MG (50000 UT) capsule Take 50,000 Units by mouth once a week.     Etonogestrel  (NEXPLANON  [place])      HYDROcodone -acetaminophen  (NORCO) 7.5-325 MG tablet Take 1 tablet by mouth every 6 (six) hours as needed.     ibuprofen  (ADVIL ) 800 MG tablet Take 800 mg by mouth every 6 (six) hours as needed. for pain     oxyCODONE -acetaminophen  (PERCOCET) 5-325 MG tablet Take 1 tablet by mouth every 6 (six) hours as needed.     No current facility-administered medications on file prior to visit.   Health Maintenance  Topic Date Due   Yearly kidney health urinalysis for diabetes  Never done   Hepatitis C Screening  Never done   Hepatitis B Vaccine (1 of 3 - 19+ 3-dose series) Never done   Pap Smear  Never done   Pneumococcal Vaccine (2 of 2 - PCV)  02/13/2017   HPV Vaccine (1 - 3-dose SCDM series) Never done   Yearly kidney function blood test for diabetes  08/03/2023   COVID-19 Vaccine (1 - 2025-26 season) Never done   Flu Shot  11/19/2024*   DTaP/Tdap/Td vaccine (3 - Td or Tdap) 03/10/2029   HIV Screening  Completed   Meningitis B Vaccine  Aged Out  *Topic was postponed. The date shown is not the original due date.    Objective:   Vitals:   07/16/24 1124  BP: 139/87  Pulse: 100  Resp: 16  SpO2: 99%  Weight: 253 lb 3.2 oz (114.9 kg)   BP Readings from Last 3 Encounters:  07/16/24 139/87  06/17/24 120/79  09/05/22 124/84      Physical Exam Vitals reviewed.  Constitutional:      Appearance: Normal appearance. She is obese.  HENT:     Head: Normocephalic.     Right Ear: Tympanic membrane, ear canal and external ear normal.     Left Ear: Tympanic membrane, ear canal and external ear normal.     Nose: Nose normal.     Mouth/Throat:     Mouth: Mucous membranes are moist.  Eyes:     Extraocular Movements: Extraocular movements intact.     Pupils: Pupils  are equal, round, and reactive to light.  Cardiovascular:     Rate and Rhythm: Normal rate and regular rhythm.  Pulmonary:     Effort: Pulmonary effort is normal.     Breath sounds: Normal breath sounds.  Abdominal:     General: Bowel sounds are normal.     Palpations: Abdomen is soft.  Musculoskeletal:        General: Normal range of motion.     Cervical back: Normal range of motion.  Skin:    General: Skin is warm and dry.  Neurological:     Mental Status: She is alert and oriented to person, place, and time.  Psychiatric:        Mood and Affect: Mood normal.        Behavior: Behavior normal.        Thought Content: Thought content normal.       Assessment & Plan   Karen Frederick Frederick was seen today for vaginal discharge.  Diagnoses and all orders for this visit:  Vaginal discharge -     Cervicovaginal ancillary only    Patient have been counseled  extensively about nutrition and exercise. Other issues discussed during this visit include: low cholesterol diet, weight control and daily exercise, foot care, annual eye examinations at Ophthalmology, importance of adherence with medications and regular follow-up. We also discussed long term complications of uncontrolled diabetes and hypertension.   As needed  The patient was given clear instructions to go to ER or return to medical center if symptoms don't improve, worsen or new problems develop. The patient verbalized understanding. The patient was told to call to get lab results if they haven't heard anything in the next week.   This note has been created with Education officer, environmental. Any transcriptional errors are unintentional.   Karen Frederick SHAUNNA Bohr, NP 07/16/2024, 2:56 PM

## 2024-07-19 LAB — CERVICOVAGINAL ANCILLARY ONLY
Bacterial Vaginitis (gardnerella): POSITIVE — AB
Candida Glabrata: NEGATIVE
Candida Vaginitis: NEGATIVE
Chlamydia: NEGATIVE
Comment: NEGATIVE
Comment: NEGATIVE
Comment: NEGATIVE
Comment: NEGATIVE
Comment: NEGATIVE
Comment: NORMAL
Neisseria Gonorrhea: NEGATIVE
Trichomonas: NEGATIVE

## 2024-07-21 ENCOUNTER — Ambulatory Visit (INDEPENDENT_AMBULATORY_CARE_PROVIDER_SITE_OTHER): Payer: Self-pay | Admitting: Primary Care

## 2024-07-21 DIAGNOSIS — B9689 Other specified bacterial agents as the cause of diseases classified elsewhere: Secondary | ICD-10-CM

## 2024-07-21 MED ORDER — METRONIDAZOLE 500 MG PO TABS: 500.0000 mg | ORAL_TABLET | Freq: Two times a day (BID) | ORAL | 0 refills | Status: AC

## 2024-07-25 ENCOUNTER — Other Ambulatory Visit (INDEPENDENT_AMBULATORY_CARE_PROVIDER_SITE_OTHER): Payer: Self-pay

## 2024-07-25 MED ORDER — FLUCONAZOLE 150 MG PO TABS: 150.0000 mg | ORAL_TABLET | Freq: Once | ORAL | 0 refills | Status: AC

## 2024-07-27 DIAGNOSIS — E119 Type 2 diabetes mellitus without complications: Secondary | ICD-10-CM | POA: Diagnosis not present

## 2024-07-27 DIAGNOSIS — E66813 Obesity, class 3: Secondary | ICD-10-CM | POA: Diagnosis not present

## 2024-07-27 DIAGNOSIS — E559 Vitamin D deficiency, unspecified: Secondary | ICD-10-CM | POA: Diagnosis not present

## 2024-07-27 DIAGNOSIS — Z6841 Body Mass Index (BMI) 40.0 and over, adult: Secondary | ICD-10-CM | POA: Diagnosis not present

## 2024-07-27 DIAGNOSIS — E782 Mixed hyperlipidemia: Secondary | ICD-10-CM | POA: Diagnosis not present

## 2024-08-07 ENCOUNTER — Emergency Department (HOSPITAL_COMMUNITY)
Admission: EM | Admit: 2024-08-07 | Discharge: 2024-08-07 | Disposition: A | Discharge status: Home / Self Care | Attending: Emergency Medicine | Admitting: Emergency Medicine

## 2024-08-07 ENCOUNTER — Encounter (HOSPITAL_COMMUNITY): Payer: Self-pay | Admitting: *Deleted

## 2024-08-07 ENCOUNTER — Other Ambulatory Visit: Payer: Self-pay

## 2024-08-07 ENCOUNTER — Emergency Department (HOSPITAL_COMMUNITY)

## 2024-08-07 DIAGNOSIS — R059 Cough, unspecified: Secondary | ICD-10-CM | POA: Diagnosis present

## 2024-08-07 DIAGNOSIS — R0789 Other chest pain: Secondary | ICD-10-CM | POA: Diagnosis not present

## 2024-08-07 DIAGNOSIS — J111 Influenza due to unidentified influenza virus with other respiratory manifestations: Secondary | ICD-10-CM | POA: Insufficient documentation

## 2024-08-07 DIAGNOSIS — J101 Influenza due to other identified influenza virus with other respiratory manifestations: Secondary | ICD-10-CM | POA: Diagnosis not present

## 2024-08-07 DIAGNOSIS — R079 Chest pain, unspecified: Secondary | ICD-10-CM | POA: Diagnosis not present

## 2024-08-07 LAB — RESP PANEL BY RT-PCR (RSV, FLU A&B, COVID)  RVPGX2
Influenza A by PCR: NEGATIVE
Influenza B by PCR: POSITIVE — AB
Resp Syncytial Virus by PCR: NEGATIVE
SARS Coronavirus 2 by RT PCR: NEGATIVE

## 2024-08-07 NOTE — ED Provider Notes (Signed)
 [place] [facility] AT [facility] Provider Note   CSN: [id number] Arrival date & time: 08/07/24  1645     Patient presents with: Fever, Generalized Body Aches, and Chest Pain   Karen Frederick is a 28 y.o. female otherwise healthy presents with complaints of chest pain with productive cough since Sunday.  Endorses subjective fevers and chills and myalgias.  No recent sick contacts.  Has had no vomiting.  Has no cardiac history or respiratory issues.  No history of PE.  Is not on birth control.  No recent surgeries, travel or hospitalizations.  Chest pain is not exertional.  Is only with coughing.    Fever Associated symptoms: chest pain   Chest Pain Associated symptoms: fever    Past Medical History:  Diagnosis Date   Anemia    with pregnacy   Bipolar depression (HCC)    dx age 75   Depression    Dyspnea    history, no current problem   Gonorrhea    Neuromuscular disorder (HCC)    patient denies this dx on 09/24/21   Trichomonal vaginitis    Vaginal Pap smear, abnormal    Past Surgical History:  Procedure Laterality Date   DILATION AND CURETTAGE OF UTERUS N/A 09/03/2022   Procedure: DILATATION AND CURETTAGE;  Surgeon: Henry Slough, MD;  Location: [facility] ORS;  Service: Obstetrics;  Laterality: N/A;   DILATION AND EVACUATION N/A 07/17/2019   Procedure: DILATATION AND EVACUATION;  Surgeon: Henry Slough, MD;  Location: [facility] OR;  Service: Gynecology;  Laterality: N/A;   DILATION AND EVACUATION N/A 09/27/2021   Procedure: DILATATION AND EVACUATION;  Surgeon: Bettina Muskrat, MD;  Location: [facility] OR;  Service: Gynecology;  Laterality: N/A;   HERNIA REPAIR     umbilical- age 89   NEXPLANON  TRAY  09/05/2022        Prior to Admission medications  Medication Sig Start Date End Date Taking? Authorizing Provider  atorvastatin  (LIPITOR) 20 MG tablet Take 1 tablet (20 mg total) by mouth daily. 06/17/24   Celestia Rosaline SQUIBB, NP  cephALEXin  (KEFLEX ) 500 MG capsule  Take 500 mg by mouth 4 (four) times daily.    [provider]  Cholecalciferol 1.25 MG (50000 UT) capsule Take 50,000 Units by mouth once a week. 09/09/20   [provider]  Etonogestrel  (NEXPLANON  [place])     [provider]  HYDROcodone -acetaminophen  (NORCO) 7.5-325 MG tablet Take 1 tablet by mouth every 6 (six) hours as needed.    [provider]  ibuprofen  (ADVIL ) 800 MG tablet Take 800 mg by mouth every 6 (six) hours as needed. for pain    [provider]  metroNIDAZOLE  (FLAGYL ) 500 MG tablet Take 1 tablet (500 mg total) by mouth 2 (two) times daily. 07/21/24   Celestia Rosaline SQUIBB, NP  oxyCODONE -acetaminophen  (PERCOCET) 5-325 MG tablet Take 1 tablet by mouth every 6 (six) hours as needed. 09/27/21   [provider]    Allergies: Patient has no known allergies.    Review of Systems  Constitutional:  Positive for fever.  Cardiovascular:  Positive for chest pain.    Updated Vital Signs BP (!) 140/85 (BP Location: Right Arm)   Pulse (!) 102   Temp 99.9 F (37.7 C)   Resp 18   Ht 5' 7 (1.702 m)   Wt 114.9 kg   LMP 08/01/2024   SpO2 100%   BMI 39.67 kg/m   Physical Exam Vitals and nursing note reviewed.  Constitutional:  General: She is not in acute distress.    Appearance: She is well-developed.  HENT:     Head: Normocephalic and atraumatic.  Eyes:     Conjunctiva/sclera: Conjunctivae normal.  Cardiovascular:     Rate and Rhythm: Normal rate and regular rhythm.     Heart sounds: No murmur heard. Pulmonary:     Effort: Pulmonary effort is normal. No respiratory distress.     Breath sounds: Normal breath sounds.  Abdominal:     Palpations: Abdomen is soft.     Tenderness: There is no abdominal tenderness.  Musculoskeletal:        General: No swelling.     Cervical back: Neck supple.  Skin:    General: Skin is warm and dry.     Capillary Refill: Capillary refill takes less than 2 seconds.  Neurological:     Mental  Status: She is alert.  Psychiatric:        Mood and Affect: Mood normal.     (all labs ordered are listed, but only abnormal results are displayed) Labs Reviewed  RESP PANEL BY RT-PCR (RSV, FLU A&B, COVID)  RVPGX2 - Abnormal; Notable for the following components:      Result Value   Influenza B by PCR POSITIVE (*)    All other components within normal limits    EKG: None  Radiology: DG Chest 2 View Result Date: 08/07/2024 EXAM: 2 VIEW(S) XRAY OF THE CHEST 08/07/2024 07:01:00 PM COMPARISON: None available. CLINICAL HISTORY: cp FINDINGS: LUNGS AND PLEURA: No focal pulmonary opacity. No pleural effusion. No pneumothorax. HEART AND MEDIASTINUM: No acute abnormality of the cardiac and mediastinal silhouettes. BONES AND SOFT TISSUES: No acute osseous abnormality. IMPRESSION: 1. No acute process. Electronically signed by: Greig Pique MD 08/07/2024 07:09 PM EST RP Workstation: [id number]     Procedures   Medications Ordered in the ED - No data to display  Clinical Course as of 08/07/24 2025  Wed Aug 07, 2024  2019 Patient evaluated for URI symptoms over the past several days.  Positive for influenza B.   She is nontoxic-appearing. Discharged with supportive care and strict return precautions. [JT]    Clinical Course User Index [JT] Donnajean Lynwood DEL, PA-C                                 Medical Decision Making Amount and/or Complexity of Data Reviewed Radiology: ordered.   This patient presents to the ED with chief complaint(s) of cough .  The complaint involves an extensive differential diagnosis and also carries with it a high risk of complications and morbidity.   Pertinent past medical history as listed in HPI  The differential diagnosis includes  No evidence of consolidation on x-ray.  Do not suspect pneumonia.  Do not suspect strep either.  Symptoms are not exertional and only with a cough.  Do not suspect ACS.  Has no risk factors for PE. Additional history  obtained: Records reviewed Care Everywhere/External Records  Disposition:   Patient will be discharged home. The patient has been appropriately medically screened and/or stabilized in the ED. I have low suspicion for any other emergent medical condition which would require further screening, evaluation or treatment in the ED or require inpatient management. At time of discharge the patient is hemodynamically stable and in no acute distress. I have discussed work-up results and diagnosis with patient and answered all questions. Patient is agreeable with discharge plan.  We discussed strict return precautions for returning to the emergency department and they verbalized understanding.     Social Determinants of Health:   none  This note was dictated with voice recognition software.  Despite best efforts at proofreading, errors may have occurred which can change the documentation meaning.       Final diagnoses:  Influenza    ED Discharge Orders     None          Donnajean Lynwood DEL, PA-C 08/07/24 2025    Kingsley, Victoria K, [place] 08/07/24 2208

## 2024-08-07 NOTE — ED Notes (Signed)
 The pts 28 year old was ill last week with similar symptoms  she treated him at home

## 2024-08-07 NOTE — Discharge Instructions (Signed)
 You were evaluated in the emergency room for flulike symptoms.  You tested positive for the flu..You may use Tylenol  1000 mg and/or Motrin  600 mg every 4-6 hours up to 3 times a day for fever or body aches.  Please keep in mind that this dosing is not meant to be continued long-term and that many over-the-counter cough and flu medications contain acetaminophen  or ibuprofen . You can expect your current symptoms to linger over the next week or two but please return to the emergency room if you experience any new or worsening symptoms including persistent fevers, worsening productive cough and persistent vomiting. Please follow-up with your primary care provider regarding your ER visit.

## 2024-08-07 NOTE — ED Provider Triage Note (Signed)
 Emergency Medicine Provider Triage Evaluation Note  Karen Frederick , a 28 y.o. female  was evaluated in triage.  Pt complains of chest pain with productive cough since Sunday.  Recent sick contact.  No vomiting.  No cardiac or respiratory issues..  Review of Systems  Positive: Negative:   Physical Exam  BP (!) 140/85 (BP Location: Right Arm)   Pulse (!) 102   Temp 99.9 F (37.7 C)   Resp 18   Ht 5' 7 (1.702 m)   Wt 114.9 kg   SpO2 100%   BMI 39.67 kg/m  Gen:   Awake, no distress   Resp:  Normal effort  MSK:   Moves extremities without difficulty  Other:    Medical Decision Making  Medically screening exam initiated at 6:08 PM.  Appropriate orders placed.  Katty LEZLI DANEK was informed that the remainder of the evaluation will be completed by another provider, this initial triage assessment does not replace that evaluation, and the importance of remaining in the ED until their evaluation is complete.     Donnajean Lynwood DEL, PA-C 08/07/24 1808

## 2024-08-07 NOTE — ED Triage Notes (Signed)
 Pt c.o fever as high as 103, body aches and chest pain since Sunday with cough and congestion. No known exposures

## 2024-08-20 ENCOUNTER — Ambulatory Visit: Admitting: Adult Health

## 2024-09-26 ENCOUNTER — Ambulatory Visit: Payer: Self-pay

## 2024-09-26 NOTE — Telephone Encounter (Signed)
 FYI Only or Action Required?: FYI only for provider: appointment scheduled on 10/02/24.  Patient was last seen in primary care on 07/16/2024 by Celestia Rosaline SQUIBB, NP.  Called Nurse Triage reporting Numbness.  Symptoms began about a month ago.  Interventions attempted: OTC medications: ibuprofen  800mg  , Prescription medications: ozempic, and Other: stretching.  Symptoms are: gradually worsening.  Triage Disposition: See PCP Within 2 Weeks  Patient/caregiver understands and will follow disposition?: Yes   Message from Kake T sent at 09/26/2024 12:43 PM EST  Reason for Triage: patient is diabetic- numbness and tingling in legs and feet- back pain and stiffness - pain level 8   Reason for Disposition  Back pain present > 2 weeks  Answer Assessment - Initial Assessment Questions Pt called to report back pain/ stiffness with associated bilateral leg numbness and tingling x 1 month. Pt reports recently being dx with diabetes and taking ozempic. Pt does not take any other diabetic medications or insulin, pt does see weight management counselor as well. Pt reports family hx of diabetes. Pt has been trying to stretch to ease back pain but thinks it has made pain worse at that is when numbness and tingling started in bilateral legs/feet. Pt is taking ibuprofen  as needing. Pt is working and able to do her day to day. Recommended continuing ibuprofen  and rotating with tylenol  PRN, use of heat for back after work and application of blue emu d/t stiffness. Pt agreeable and voiced appreciation. Pt willing to see alternative provider d/t availability of pcp. Appointment scheduled for evaluation. Patient agrees with plan of care, and will call back if anything changes, or if symptoms worsen.       1. ONSET: When did the pain begin? (e.g., minutes, hours, days)     X 1 month   2. LOCATION: Where does it hurt? (upper, mid or lower back)     Back pain, radiation of pain into bilateral legs with  new onset of numbness and tingling. Pt recently dx with diabetes and started ozempic  3. SEVERITY: How bad is the pain?  (e.g., Scale 1-10; mild, moderate, or severe)     8/10  4. PATTERN: Is the pain constant? (e.g., yes, no; constant, intermittent)      Intermittent   5. RADIATION: Does the pain shoot into your legs or somewhere else?     Yes; bilaterall legs   6. CAUSE:  What do you think is causing the back pain?      Unsure; pt reports new dx of diabetes and family hx of diabetes. Pt reports she is overweight and sees a weight counselor   7. BACK OVERUSE:  Any recent lifting of heavy objects, strenuous work or exercise?     No   8. MEDICINES: What have you taken so far for the pain? (e.g., nothing, acetaminophen , NSAIDS)     Ibuprofen  800mg  PRN, stretching and ozempic   9. NEUROLOGIC SYMPTOMS: Do you have any weakness, numbness, or problems with bowel/bladder control?     No   10. OTHER SYMPTOMS: Do you have any other symptoms? (e.g., fever, abdomen pain, burning with urination, blood in urine)       None  Protocols used: Back Pain-A-AH

## 2024-10-02 ENCOUNTER — Ambulatory Visit (INDEPENDENT_AMBULATORY_CARE_PROVIDER_SITE_OTHER): Payer: Self-pay | Admitting: Primary Care

## 2024-10-02 ENCOUNTER — Ambulatory Visit: Payer: Self-pay | Admitting: *Deleted
